# Patient Record
Sex: Female | Born: 1975 | Race: Black or African American | Hispanic: No | Smoking: Never smoker
Health system: Southern US, Community
[De-identification: ages and names within clinical notes are randomized; demographics above are authoritative.]

## PROBLEM LIST (undated history)

## (undated) DIAGNOSIS — D649 Anemia, unspecified: Secondary | ICD-10-CM

---
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
## 1999-02-27 ENCOUNTER — Emergency Department (HOSPITAL_COMMUNITY): Admission: EM | Admit: 1999-02-27 | Discharge: 1999-02-27 | Payer: Self-pay | Admitting: Emergency Medicine

## 1999-02-27 ENCOUNTER — Encounter: Payer: Self-pay | Admitting: Emergency Medicine

## 1999-12-25 ENCOUNTER — Emergency Department (HOSPITAL_COMMUNITY): Admission: EM | Admit: 1999-12-25 | Discharge: 1999-12-25 | Payer: Self-pay | Admitting: Emergency Medicine

## 2000-11-04 ENCOUNTER — Inpatient Hospital Stay (HOSPITAL_COMMUNITY): Admission: AD | Admit: 2000-11-04 | Discharge: 2000-11-04 | Payer: Self-pay | Admitting: *Deleted

## 2001-02-21 ENCOUNTER — Inpatient Hospital Stay (HOSPITAL_COMMUNITY): Admission: AD | Admit: 2001-02-21 | Discharge: 2001-02-21 | Payer: Self-pay | Admitting: Obstetrics

## 2001-02-24 ENCOUNTER — Inpatient Hospital Stay (HOSPITAL_COMMUNITY): Admission: AD | Admit: 2001-02-24 | Discharge: 2001-02-24 | Payer: Self-pay | Admitting: Obstetrics & Gynecology

## 2001-08-11 ENCOUNTER — Inpatient Hospital Stay (HOSPITAL_COMMUNITY): Admission: AD | Admit: 2001-08-11 | Discharge: 2001-08-11 | Payer: Self-pay | Admitting: Obstetrics

## 2001-08-13 ENCOUNTER — Inpatient Hospital Stay (HOSPITAL_COMMUNITY): Admission: AD | Admit: 2001-08-13 | Discharge: 2001-08-13 | Payer: Self-pay | Admitting: Obstetrics

## 2002-06-15 ENCOUNTER — Emergency Department (HOSPITAL_COMMUNITY): Admission: EM | Admit: 2002-06-15 | Discharge: 2002-06-15 | Payer: Self-pay | Admitting: Emergency Medicine

## 2002-06-18 ENCOUNTER — Emergency Department (HOSPITAL_COMMUNITY): Admission: EM | Admit: 2002-06-18 | Discharge: 2002-06-18 | Payer: Self-pay | Admitting: *Deleted

## 2002-11-02 ENCOUNTER — Inpatient Hospital Stay (HOSPITAL_COMMUNITY): Admission: AD | Admit: 2002-11-02 | Discharge: 2002-11-02 | Payer: Self-pay | Admitting: Obstetrics and Gynecology

## 2002-11-07 ENCOUNTER — Inpatient Hospital Stay (HOSPITAL_COMMUNITY): Admission: AD | Admit: 2002-11-07 | Discharge: 2002-11-07 | Payer: Self-pay | Admitting: *Deleted

## 2002-11-16 ENCOUNTER — Encounter: Payer: Self-pay | Admitting: Obstetrics and Gynecology

## 2002-11-16 ENCOUNTER — Inpatient Hospital Stay (HOSPITAL_COMMUNITY): Admission: AD | Admit: 2002-11-16 | Discharge: 2002-11-16 | Payer: Self-pay | Admitting: Obstetrics and Gynecology

## 2002-11-28 ENCOUNTER — Inpatient Hospital Stay (HOSPITAL_COMMUNITY): Admission: AD | Admit: 2002-11-28 | Discharge: 2002-11-28 | Payer: Self-pay | Admitting: *Deleted

## 2002-12-01 ENCOUNTER — Inpatient Hospital Stay (HOSPITAL_COMMUNITY): Admission: AD | Admit: 2002-12-01 | Discharge: 2002-12-01 | Payer: Self-pay | Admitting: *Deleted

## 2003-01-22 ENCOUNTER — Encounter: Payer: Self-pay | Admitting: Emergency Medicine

## 2003-01-22 ENCOUNTER — Emergency Department (HOSPITAL_COMMUNITY): Admission: EM | Admit: 2003-01-22 | Discharge: 2003-01-22 | Payer: Self-pay | Admitting: Emergency Medicine

## 2004-08-07 ENCOUNTER — Ambulatory Visit: Payer: Self-pay | Admitting: Internal Medicine

## 2005-04-22 ENCOUNTER — Inpatient Hospital Stay (HOSPITAL_COMMUNITY): Admission: AD | Admit: 2005-04-22 | Discharge: 2005-04-22 | Payer: Self-pay | Admitting: Obstetrics & Gynecology

## 2005-05-19 ENCOUNTER — Emergency Department (HOSPITAL_COMMUNITY): Admission: EM | Admit: 2005-05-19 | Discharge: 2005-05-19 | Payer: Self-pay | Admitting: Emergency Medicine

## 2005-05-21 ENCOUNTER — Inpatient Hospital Stay (HOSPITAL_COMMUNITY): Admission: EM | Admit: 2005-05-21 | Discharge: 2005-05-25 | Payer: Self-pay | Admitting: Emergency Medicine

## 2005-05-21 ENCOUNTER — Ambulatory Visit: Payer: Self-pay | Admitting: Infectious Diseases

## 2005-07-12 ENCOUNTER — Ambulatory Visit (HOSPITAL_COMMUNITY): Admission: RE | Admit: 2005-07-12 | Discharge: 2005-07-12 | Payer: Self-pay | Admitting: Obstetrics & Gynecology

## 2005-07-18 ENCOUNTER — Inpatient Hospital Stay (HOSPITAL_COMMUNITY): Admission: AD | Admit: 2005-07-18 | Discharge: 2005-07-19 | Payer: Self-pay | Admitting: Obstetrics

## 2005-08-28 ENCOUNTER — Ambulatory Visit (HOSPITAL_COMMUNITY): Admission: RE | Admit: 2005-08-28 | Discharge: 2005-08-28 | Payer: Self-pay | Admitting: Obstetrics and Gynecology

## 2005-09-17 ENCOUNTER — Inpatient Hospital Stay (HOSPITAL_COMMUNITY): Admission: AD | Admit: 2005-09-17 | Discharge: 2005-09-17 | Payer: Self-pay | Admitting: Obstetrics & Gynecology

## 2005-09-24 ENCOUNTER — Inpatient Hospital Stay (HOSPITAL_COMMUNITY): Admission: AD | Admit: 2005-09-24 | Discharge: 2005-09-24 | Payer: Self-pay | Admitting: Obstetrics

## 2005-09-28 ENCOUNTER — Ambulatory Visit (HOSPITAL_COMMUNITY): Admission: RE | Admit: 2005-09-28 | Discharge: 2005-09-28 | Payer: Self-pay | Admitting: Obstetrics & Gynecology

## 2005-10-11 ENCOUNTER — Ambulatory Visit (HOSPITAL_COMMUNITY): Admission: RE | Admit: 2005-10-11 | Discharge: 2005-10-11 | Payer: Self-pay | Admitting: Obstetrics & Gynecology

## 2005-11-20 ENCOUNTER — Inpatient Hospital Stay (HOSPITAL_COMMUNITY): Admission: AD | Admit: 2005-11-20 | Discharge: 2005-11-22 | Payer: Self-pay | Admitting: Obstetrics & Gynecology

## 2006-02-04 IMAGING — US US OB LIMITED
1 series · 14 of 28 positions shown · non-contrast
Comparison: none

CLINICAL DATA: Assess amniotic fluid volume.

[Series 1: us ob limited · 0.33mm/px · 14 of 28 slices shown]
[im 2/28]
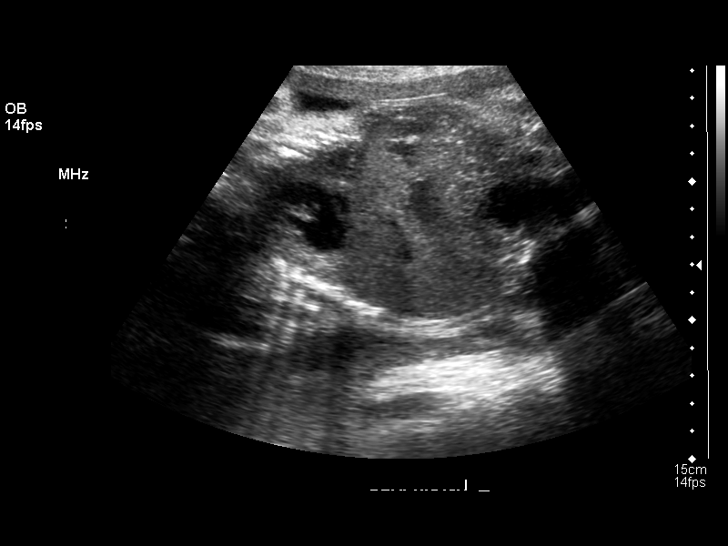
[im 4/28]
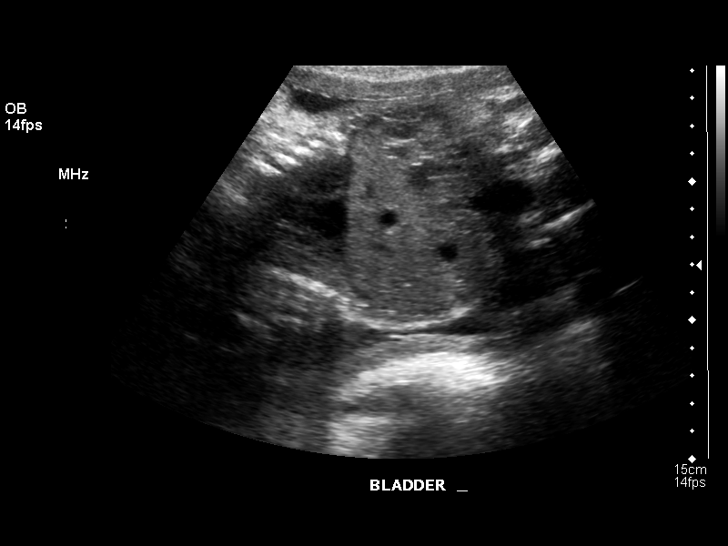
[im 6/28]
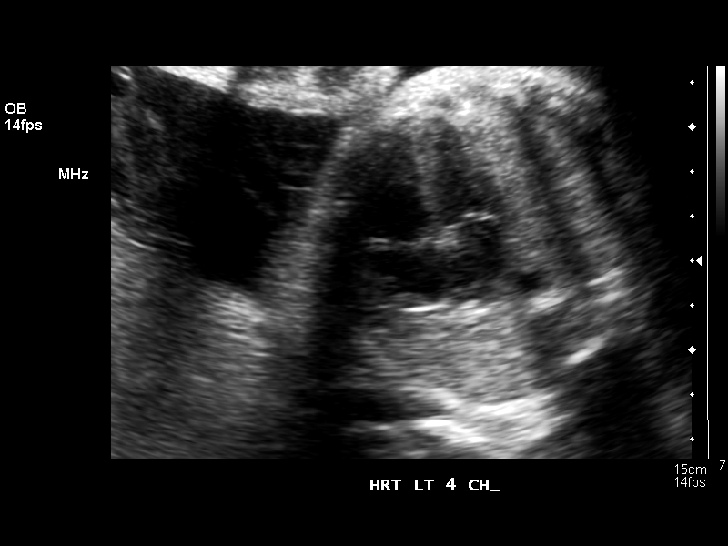
[im 8/28]
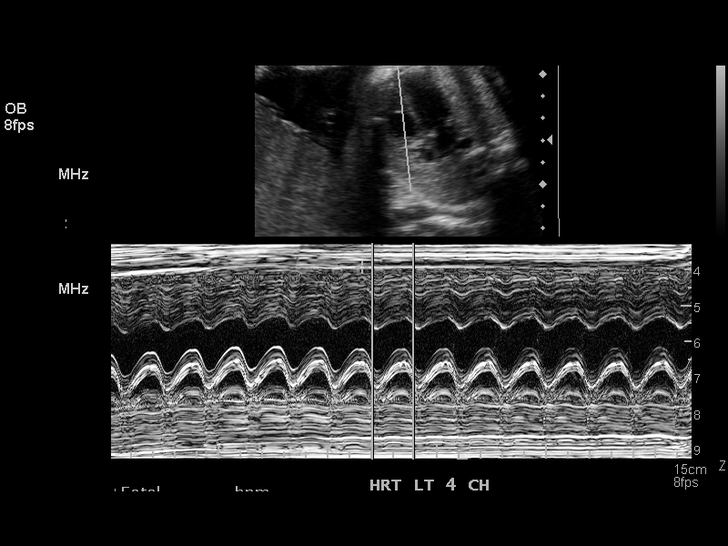
[im 10/28]
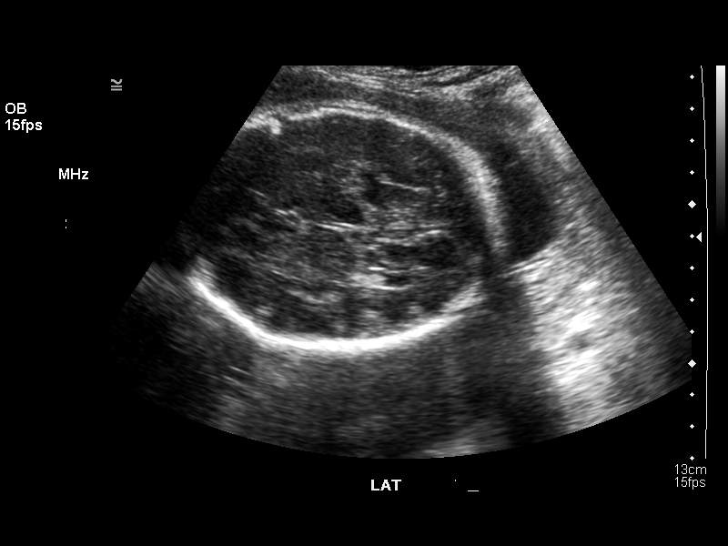
[im 12/28]
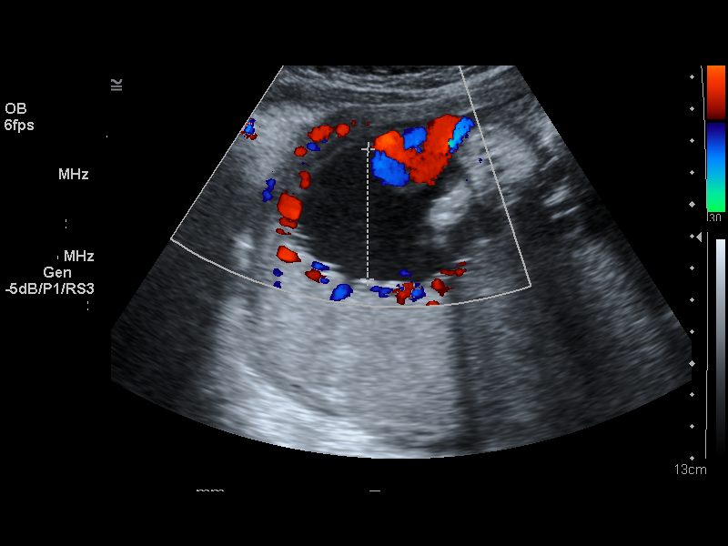
[im 14/28]
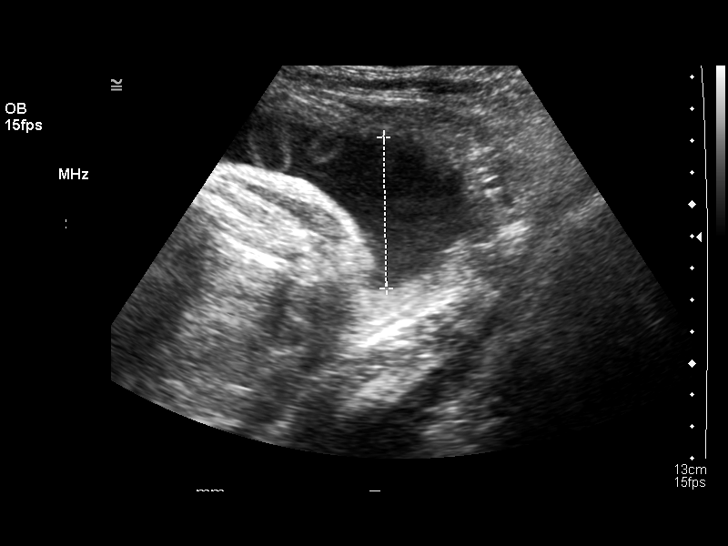
[im 16/28]
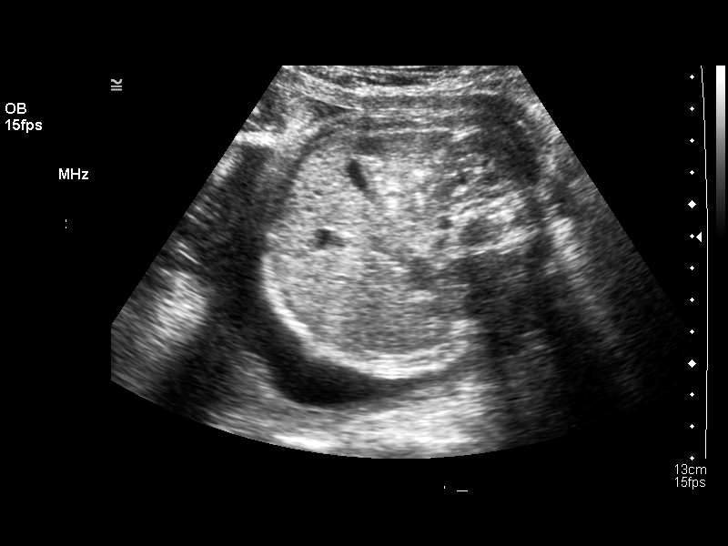
[im 18/28]
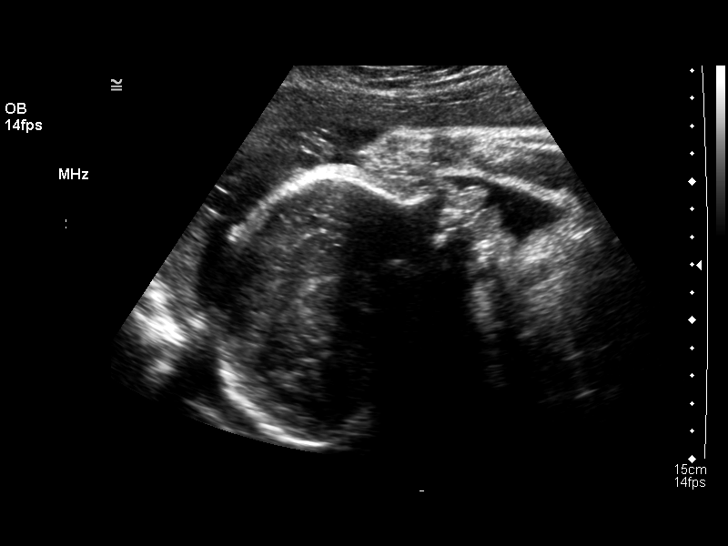
[im 20/28]
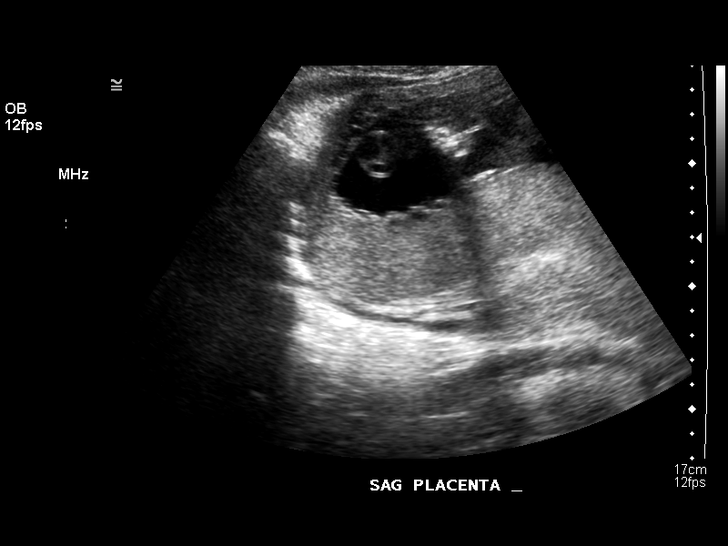
[im 22/28]
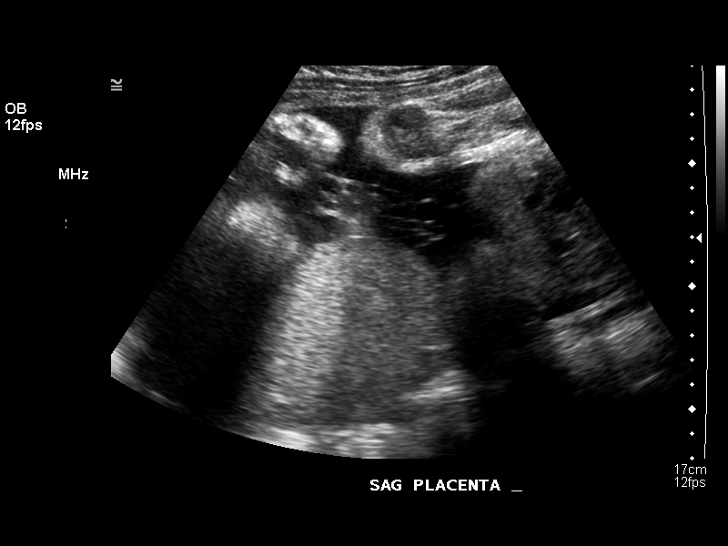
[im 24/28]
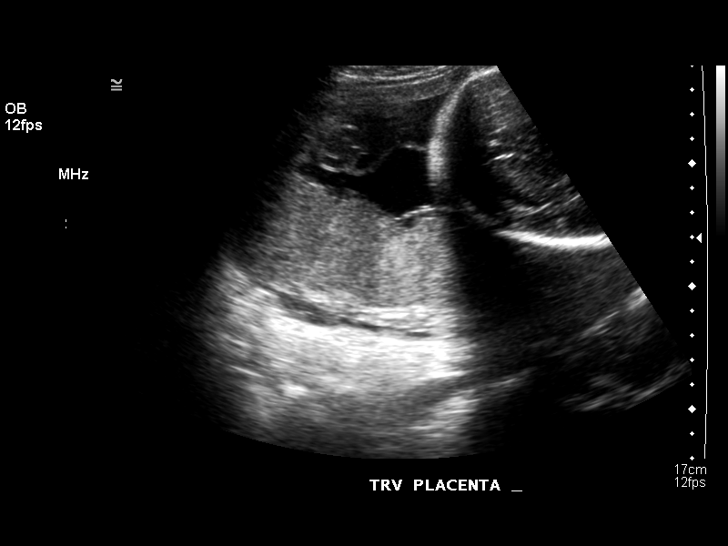
[im 26/28]
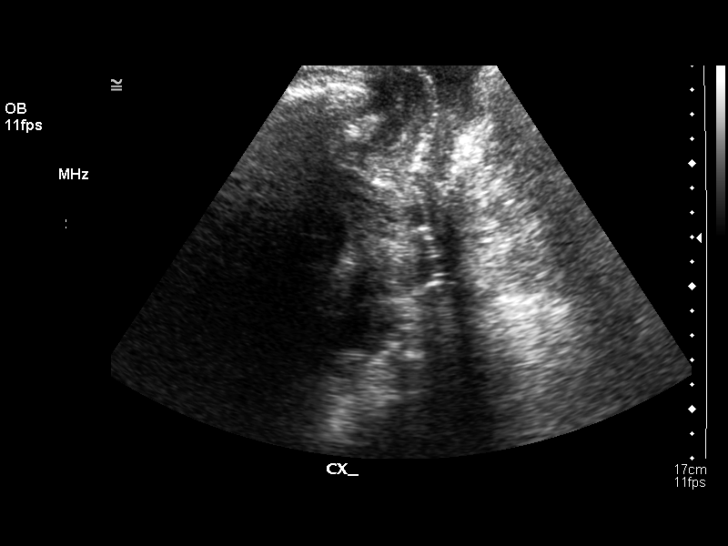
[im 28/28]
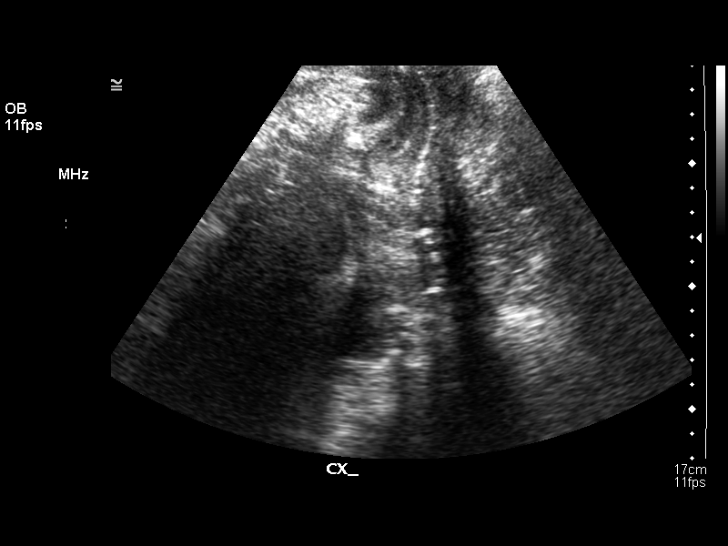

[14 of 28 positions shown; findings below may reference images not displayed]

LIMITED OBSTETRICAL ULTRASOUND:
Number of Fetuses:  1
Heart Rate:  160
Movement:  Yes
Breathing:  Yes
Presentation:  Breech
Placental Location:  Posterior
Grade:  I
Previa:  No
Amniotic Fluid (Subjective):  Normal
Amniotic Fluid (Objective):  11.9 cm AFI (5th -95th%ile = 8.8 ? 23.8 cm for 31 wks)

Fetal measurements and complete anatomic evaluation were not requested.  The following fetal anatomy was visualized during this exam:  Lateral ventricles, four chamber heart, stomach, kidneys, bladder, diaphragm, and male genitalia.  

MATERNAL UTERINE AND ADNEXAL FINDINGS
Cervix:  3.2 cm Translabially
IMPRESSION: 1.  Subjectively and quantitatively normal amniotic fluid volume.
2.  Normal cervical length.
3.  No late developing fetal anatomic abnormalities are identified associated with the lateral ventricles, four chamber heart, stomach, kidneys, or bladder.  
4.  Current breech positioning.

## 2007-05-27 ENCOUNTER — Emergency Department (HOSPITAL_COMMUNITY): Admission: EM | Admit: 2007-05-27 | Discharge: 2007-05-27 | Payer: Self-pay | Admitting: Emergency Medicine

## 2010-01-23 ENCOUNTER — Ambulatory Visit: Payer: Self-pay | Admitting: Obstetrics & Gynecology

## 2010-01-23 ENCOUNTER — Observation Stay (HOSPITAL_COMMUNITY): Admission: AD | Admit: 2010-01-23 | Discharge: 2010-01-24 | Payer: Self-pay | Admitting: Obstetrics & Gynecology

## 2010-05-07 ENCOUNTER — Ambulatory Visit: Payer: Self-pay | Admitting: Nurse Practitioner

## 2010-05-07 ENCOUNTER — Inpatient Hospital Stay (HOSPITAL_COMMUNITY): Admission: AD | Admit: 2010-05-07 | Discharge: 2010-05-07 | Payer: Self-pay | Admitting: Obstetrics and Gynecology

## 2010-06-22 ENCOUNTER — Ambulatory Visit: Payer: Self-pay | Admitting: Gynecology

## 2010-06-22 ENCOUNTER — Inpatient Hospital Stay (HOSPITAL_COMMUNITY): Admission: AD | Admit: 2010-06-22 | Discharge: 2010-06-22 | Payer: Self-pay | Admitting: Obstetrics & Gynecology

## 2010-09-30 ENCOUNTER — Encounter: Payer: Self-pay | Admitting: Obstetrics & Gynecology

## 2010-11-22 LAB — WET PREP, GENITAL: Trich, Wet Prep: NONE SEEN

## 2010-11-22 LAB — URINALYSIS, ROUTINE W REFLEX MICROSCOPIC
Bilirubin Urine: NEGATIVE
Ketones, ur: NEGATIVE mg/dL
Nitrite: NEGATIVE
Protein, ur: NEGATIVE mg/dL
Specific Gravity, Urine: 1.025 (ref 1.005–1.030)
Urobilinogen, UA: 1 mg/dL (ref 0.0–1.0)

## 2010-11-22 LAB — DIFFERENTIAL
Basophils Relative: 0 % (ref 0–1)
Eosinophils Relative: 1 % (ref 0–5)
Lymphs Abs: 2.1 10*3/uL (ref 0.7–4.0)
Monocytes Absolute: 0.3 10*3/uL (ref 0.1–1.0)
Monocytes Relative: 5 % (ref 3–12)

## 2010-11-22 LAB — CBC
HCT: 30.7 % — ABNORMAL LOW (ref 36.0–46.0)
Hemoglobin: 9.8 g/dL — ABNORMAL LOW (ref 12.0–15.0)
MCH: 24.3 pg — ABNORMAL LOW (ref 26.0–34.0)
MCV: 75.8 fL — ABNORMAL LOW (ref 78.0–100.0)
RBC: 4.05 MIL/uL (ref 3.87–5.11)

## 2010-11-22 LAB — GC/CHLAMYDIA PROBE AMP, GENITAL: Chlamydia, DNA Probe: NEGATIVE

## 2010-11-23 LAB — GC/CHLAMYDIA PROBE AMP, GENITAL: GC Probe Amp, Genital: NEGATIVE

## 2010-11-23 LAB — URINALYSIS, ROUTINE W REFLEX MICROSCOPIC
Bilirubin Urine: NEGATIVE
Glucose, UA: NEGATIVE mg/dL
Ketones, ur: NEGATIVE mg/dL
Nitrite: NEGATIVE
Protein, ur: NEGATIVE mg/dL

## 2010-11-23 LAB — URINE MICROSCOPIC-ADD ON

## 2010-11-23 LAB — WET PREP, GENITAL
Trich, Wet Prep: NONE SEEN
Yeast Wet Prep HPF POC: NONE SEEN

## 2010-11-23 LAB — POCT PREGNANCY, URINE: Preg Test, Ur: NEGATIVE

## 2010-11-27 LAB — URINE MICROSCOPIC-ADD ON

## 2010-11-27 LAB — CROSSMATCH

## 2010-11-27 LAB — URINALYSIS, ROUTINE W REFLEX MICROSCOPIC
Bilirubin Urine: NEGATIVE
Glucose, UA: NEGATIVE mg/dL
Ketones, ur: NEGATIVE mg/dL
Protein, ur: 30 mg/dL — AB
Urobilinogen, UA: 2 mg/dL — ABNORMAL HIGH (ref 0.0–1.0)

## 2010-11-27 LAB — CBC
HCT: 24.8 % — ABNORMAL LOW (ref 36.0–46.0)
Hemoglobin: 6.2 g/dL — CL (ref 12.0–15.0)
Hemoglobin: 7.9 g/dL — ABNORMAL LOW (ref 12.0–15.0)
MCHC: 30.8 g/dL (ref 30.0–36.0)
MCHC: 31.8 g/dL (ref 30.0–36.0)
MCV: 62.4 fL — ABNORMAL LOW (ref 78.0–100.0)
RBC: 3.23 MIL/uL — ABNORMAL LOW (ref 3.87–5.11)
RDW: 22.3 % — ABNORMAL HIGH (ref 11.5–15.5)
RDW: 27.6 % — ABNORMAL HIGH (ref 11.5–15.5)

## 2010-11-27 LAB — GC/CHLAMYDIA PROBE AMP, GENITAL
Chlamydia, DNA Probe: POSITIVE — AB
GC Probe Amp, Genital: NEGATIVE

## 2010-11-27 LAB — WET PREP, GENITAL
Trich, Wet Prep: NONE SEEN
Yeast Wet Prep HPF POC: NONE SEEN

## 2011-01-26 NOTE — Consult Note (Signed)
Kiara [name], Kiara [name]             ACCOUNT NO.:  [id number]   MEDICAL RECORD NO.:  [id number]          PATIENT TYPE:  INP   LOCATION:  0102                         FACILITY:  [facility]   PHYSICIAN:  Artist Pais. Mina Marble, M.D.DATE OF BIRTH:  [birth year]   DATE OF CONSULTATION:  05/21/2005  DATE OF DISCHARGE:                                   CONSULTATION   REQUESTING PHYSICIAN:  Paula Libra, M.D.   REASON FOR CONSULTATION:  Kiara [name] is a 35 year old right hand dominant  who presents today with a 48-72 hour history of pain, swelling and probable  MRSA infection on the dorsal aspect of her right hand and fifth digit. She  was seen in the ER, placed on Keflex 48 hours earlier, she has gotten  significantly worse since that point in time and presents today with  worsening redness, pain and probable deep abscess of the extensor sheath  right hand fifth digit. She is an otherwise healthy 35 year old, she is [redacted]  weeks pregnant, she has no known drug allergies. No current medications. No  recent hospitalizations or surgeries.   FAMILY HISTORY:  Noncontributory.   SOCIAL HISTORY:  Noncontributory.   PHYSICAL EXAMINATION:  GENERAL:  Well-developed, well-nourished female,  pleasant, looked fatigued.  EXTREMITIES:  Examination of right hand shows pain and swelling of the  proximal phalanx, metacarpal phalangeal joint and 2 cm proximal to this  along the dorsum of the hand along the fifth metacarpal and proximal  phalangeal area. She can fully flex and extend, she does have pain with  terminal flexion, no pain over the flexor sheath but significant pain and  swelling over the extensor sheath in a small raised area consistent with  probable abscess of the extensor mechanism and probable extensor sheath  infection. She has no __________ abdominal adenopathy. X-rays are deferred  secondary to her pregnant state.   IMPRESSION:  A 35 year old female with probable worsening extensor sheath  infection  secondary to probable MRSA.   RECOMMENDATIONS:  To take her to the operating room for an I&D. She is to be  seen by infectious disease for a consult regarding IV antibiotics due to her  state of pregnancy and we will get her admitted and taken to the operating  room as soon as possible.      [name], M.D.  Electronically Signed     MAW/MEDQ  D:  05/21/2005  T:  05/22/2005  Job:  [id number]

## 2011-01-26 NOTE — Discharge Summary (Signed)
Kiara [name], Kiara [name]             ACCOUNT NO.:  [id number]   MEDICAL RECORD NO.:  [id number]          PATIENT TYPE:  INP   LOCATION:  1411                         FACILITY:  [facility]   PHYSICIAN:  Kiara [name], M.D.DATE OF BIRTH:  1975/11/23   DATE OF ADMISSION:  05/21/2005  DATE OF DISCHARGE:  05/25/2005                                 DISCHARGE SUMMARY   ADMISSION DIAGNOSIS:  Cellulitis and extensor sheath infection to the right  fifth finger.   SECONDARY DIAGNOSES:  Ten weeks pregnant.   DISCHARGE DIAGNOSES:  1.  Cellulitis and extensor sheath infection to the right fifth finger.  2.  Ten weeks pregnant.   OPERATIVE PROCEDURE:  Irrigation and debridement x2 with secondary wound  closure on the second surgery.   CONSULTATIONS:  Infectious disease for assistance with antibiotic coverage.   BRIEF HISTORY:  Patient is a 35 year old black female who presents with 48-  72 hours of pain, swelling, increasing redness, and discomfort to the right  hand, fifth digit dorsally with a probable methicillin-resistant  staphylococcus aureus infection.  She was seen in the emergency room.  Placed on Keflex.  It got significantly worse.  We were consulted for  further evaluation and treatment.   The patient is [redacted] weeks pregnant.  She has no known drug allergies.  No  current medications.  No recent hospitalization or surgery.   HOSPITAL COURSE:  Patient was admitted on May 21, 2005 for right hand  abscess and infection.  Underwent irrigation and debridement with packing of  the wound.  Cultures were obtained.  Infectious disease assisted in  antibiotic coverage.  Recommended vancomycin secondary to pregnancy.  She  did not have a good oral option secondary to drug side effects with her  pregnancy.   The first day postoperatively, the patient was doing well.  Dressing was  dry.  Neurovascularly intact.  Arrangements were made for repeat I&D and  secondary wound closure on  May 23, 2005.  Continued on IV vancomycin.  She tolerated this well and general anesthesia.  She was ready for discharge  home on May 25, 2005.  It was necessary for her to be on IV vancomycin  for five days postoperatively.  Home outpatient IV antibiotics were arranged  through [facility].  She did not meet criteria for inpatient IV  antibiotics.  She is ready for discharge on home May 25, 2005.   PERTINENT LABORATORY FINDINGS:  WBC 9.4.  Sodium 134, potassium 3.4.  Cultures were positive for methicillin-resistant staphylococcus aureus.  Blood cultures were sterile and negative.  Methicillin-resistant  staphylococcus aureus was sensitive to vancomycin.  She continued on this  for treatment.   DISCHARGE CONDITION:  Stable and improved.   DISCHARGE DIAGNOSIS:  Right hand and fifth digit extensor sheath infection  and abscess.   DISCHARGE CONDITION:  Stable and improved.   DISCHARGE INSTRUCTIONS:  Discharged home.  Home IV vancomycin x5 days, 1 gm  q.12h. with home health teaching and administration instructions.  She was  also placed on Lotrimin for yeast infection, dose per pharmacy.  She is to  follow  up in our office on  Tuesday.  She is to keep the dressing clean and  dry.  She is to contact our office and follow up if she has any increasing  redness, pain, swelling, fevers, feelings of malaise, or any concerns  whatsoever.  She is not a candidate for p.o. medications because the  medications to cover methicillin-resistant staphylococcus aureus are not  compatible with pregnancy secondary to side effects; therefore, it was  necessary for her to stay on IV vancomycin 1 gm q.12h. x5 days.      Kiara [name].      Kiara [name], M.D.  Electronically Signed    SCI/MEDQ  D:  05/31/2005  T:  06/01/2005  Job:  [id number]

## 2011-01-26 NOTE — Op Note (Signed)
[name], [name]             ACCOUNT NO.:  [id number]   MEDICAL RECORD NO.:  [id number]          PATIENT TYPE:  INP   LOCATION:  1411                         FACILITY:  [facility]   PHYSICIAN:  Artist Pais. Weingold, M.D.DATE OF BIRTH:  [birth year]   DATE OF PROCEDURE:  05/23/2005  DATE OF DISCHARGE:                                 OPERATIVE REPORT   PREOPERATIVE DIAGNOSIS:  Methicillin-resistant staphylococcus aureus  infection, right hand.   POSTOPERATIVE DIAGNOSIS:  Methicillin-resistant staphylococcus aureus  infection, right hand.   PROCEDURE:  Incision and drainage and secondary wound closure, right hand.   SURGEON:  Artist Pais. Mina Marble, M.D.   ASSISTANT:  __________.   ANESTHESIA:  General.   TOURNIQUET TIME:  None.   COMPLICATIONS:  None.   DRAINS:  None.   OPERATIVE REPORT:  Patient was taken to the operating room.  After the  induction of adequate general anesthesia, the right upper extremity was  prepped and draped in the usual sterile fashion.  Once this was done, the  wound that was previously packed open with iodoform gauze over the  metacarpophalangeal and proximal interphalangeal joints of the right hand  was opened.  The packing was removed.  The wound was then thoroughly  irrigated and debrided using 3 liters of normal saline under pulse lavage.  After this was completed, the wound was secondarily debrided and then closed  with 4-0 nylon.  A sterile dressing of Xeroform, 4x4's, compressive wraps  were applied.  The patient tolerated the procedure well.  She was taken to  the recovery room in a stable fashion.      [name], M.D.  Electronically Signed     MAW/MEDQ  D:  05/23/2005  T:  05/23/2005  Job:  [id number]

## 2011-01-26 NOTE — Op Note (Signed)
Kiara [name], Kiara [name]             ACCOUNT NO.:  [id number]   MEDICAL RECORD NO.:  [id number]          PATIENT TYPE:  INP   LOCATION:  0102                         FACILITY:  [facility]   PHYSICIAN:  Artist Pais. Weingold, M.D.DATE OF BIRTH:  [birth year]   DATE OF PROCEDURE:  05/21/2005  DATE OF DISCHARGE:                                 OPERATIVE REPORT   PREOPERATIVE DIAGNOSIS:  Infected right hand and fifth digit extensor  sheath.   POSTOPERATIVE DIAGNOSIS:  Infected right hand and fifth digit extensor  sheath.   PROCEDURE:  Incision and drainage extensor sheath, right hand, fifth digit.   SURGEON:  Artist Pais. Mina Marble, M.D.   ASSISTANT:  None.   ANESTHESIA:  General.   TOURNIQUET TIME:  Twelve minutes.   COMPLICATIONS:  None.   DRAINS:  None.   CULTURES SENT:  Two.   The wound was packed open with 1/4 inch iodoform packing.   OPERATION REPORT:  Patient was taken to the operating room.  After the  induction of general anesthesia, the right upper extremity was prepped in  the usual sterile fashion.  Esmarch was used to exsanguinated the limb.  Tourniquet was inflated 250 mmHg.  At this point in time, a longitudinal  incision was made, sent up to the metacarpophalangeal joint and the proximal  middle phalangeal joint in the midline over the fifth digit.  Once the skin  was incised, large amounts of purulent material were encountered throughout  the entire extensor sheath.  This was carefully cultured for aerobic,  anaerobic, fungal, AFB, etc.  The wound was then thoroughly irrigated with 3  liters of normal saline using post lavage and packed open with 0.25%  iodoform packing material, then dressed with Xeroform, 4x4s, and a  compressive wrap.  The patient tolerated the procedure well.  Was taken to  the recovery room in a stable fashion.      [name], M.D.  Electronically Signed    MAW/MEDQ  D:  05/21/2005  T:  05/22/2005  Job:  [id number]

## 2011-06-21 LAB — URINALYSIS, ROUTINE W REFLEX MICROSCOPIC
Bilirubin Urine: NEGATIVE
Nitrite: NEGATIVE
Specific Gravity, Urine: 1.022
pH: 6

## 2011-06-21 LAB — DIFFERENTIAL
Eosinophils Absolute: 0.1
Eosinophils Relative: 2
Lymphs Abs: 2.1
Monocytes Relative: 6
Neutrophils Relative %: 60

## 2011-06-21 LAB — CBC
MCHC: 33.7
RDW: 17.8 — ABNORMAL HIGH

## 2011-06-21 LAB — COMPREHENSIVE METABOLIC PANEL
ALT: 12
AST: 15
Calcium: 9.1
GFR calc Af Amer: >60
Sodium: 135
Total Protein: 7.2

## 2011-06-21 LAB — POCT PREGNANCY, URINE: Operator id: 173591

## 2018-06-15 ENCOUNTER — Emergency Department (HOSPITAL_COMMUNITY)
Admission: EM | Admit: 2018-06-15 | Discharge: 2018-06-16 | Disposition: A | Discharge status: Home / Self Care | Payer: Self-pay | Attending: Emergency Medicine | Admitting: Emergency Medicine

## 2018-06-15 ENCOUNTER — Emergency Department (HOSPITAL_COMMUNITY): Payer: Self-pay

## 2018-06-15 ENCOUNTER — Encounter (HOSPITAL_COMMUNITY): Payer: Self-pay

## 2018-06-15 DIAGNOSIS — N938 Other specified abnormal uterine and vaginal bleeding: Secondary | ICD-10-CM

## 2018-06-15 DIAGNOSIS — N939 Abnormal uterine and vaginal bleeding, unspecified: Secondary | ICD-10-CM | POA: Insufficient documentation

## 2018-06-15 DIAGNOSIS — D649 Anemia, unspecified: Secondary | ICD-10-CM | POA: Insufficient documentation

## 2018-06-15 LAB — BASIC METABOLIC PANEL
ANION GAP: 8 (ref 5–15)
BUN: 14 mg/dL (ref 6–20)
CALCIUM: 8.3 mg/dL — AB (ref 8.9–10.3)
CO2: 17 mmol/L — ABNORMAL LOW (ref 22–32)
Chloride: 106 mmol/L (ref 98–111)
Creatinine, Ser: 0.69 mg/dL (ref 0.44–1.00)
GFR calc Af Amer: >60 mL/min (ref >60)
Glucose, Bld: 103 mg/dL — ABNORMAL HIGH (ref 70–99)
POTASSIUM: 2.9 mmol/L — AB (ref 3.5–5.1)
SODIUM: 131 mmol/L — AB (ref 135–145)

## 2018-06-15 LAB — CBC
HEMATOCRIT: 21 % — AB (ref 36.0–46.0)
HEMOGLOBIN: 5.2 g/dL — AB (ref 12.0–15.0)
MCH: 16.8 pg — ABNORMAL LOW (ref 26.0–34.0)
MCHC: 24.8 g/dL — ABNORMAL LOW (ref 30.0–36.0)
MCV: 68 fL — ABNORMAL LOW (ref 78.0–100.0)
Platelets: 569 10*3/uL — ABNORMAL HIGH (ref 150–400)
RBC: 3.09 MIL/uL — ABNORMAL LOW (ref 3.87–5.11)
RDW: 30.5 % — AB (ref 11.5–15.5)
WBC: 8.3 10*3/uL (ref 4.0–10.5)

## 2018-06-15 LAB — I-STAT TROPONIN, ED: Troponin i, poc: 0 ng/mL (ref 0.00–0.08)

## 2018-06-15 LAB — I-STAT BETA HCG BLOOD, ED (MC, WL, AP ONLY)

## 2018-06-15 NOTE — ED Triage Notes (Signed)
To triage via EMS. Pt had been cleaning today with cleaning products and started having intermittant palpitations and diaphoresis.  No chest pain.  EMS BP 138/70 HR 120's SpO2 100 % CBG 100

## 2018-06-15 NOTE — ED Notes (Signed)
Dr. Delo at bedside. 

## 2018-06-15 NOTE — ED Notes (Addendum)
Dr. Jodi Mourning aware Hgb 5.2 *

## 2018-06-16 ENCOUNTER — Emergency Department (HOSPITAL_COMMUNITY): Payer: Self-pay

## 2018-06-16 LAB — PREPARE RBC (CROSSMATCH)

## 2018-06-16 LAB — ABO/RH: ABO/RH(D): A POS

## 2018-06-16 MED ORDER — FERROUS SULFATE 325 (65 FE) MG PO TABS: 325.0000 mg | ORAL_TABLET | Freq: Two times a day (BID) | ORAL | 1 refills | Status: DC

## 2018-06-16 MED ORDER — SODIUM CHLORIDE 0.9 % IV SOLN
10.0000 mL/h | Freq: Once | INTRAVENOUS | Status: AC
  Administered 2018-06-16: 10 mL/h via INTRAVENOUS

## 2018-06-16 NOTE — ED Notes (Signed)
Patient given discharge instructions and verbalized understanding.  Patient stable to discharge at this time.  Patient is alert and oriented to baseline.  No distressed noted at this time.  All belongings taken with the patient at discharge.   

## 2018-06-16 NOTE — Discharge Instructions (Signed)
Iron as prescribed.  Follow-up with GYN.  The contact information for the women's outpatient clinic has been provided in this discharge summary for you to call and make these arrangements.

## 2018-06-16 NOTE — ED Notes (Signed)
Pt ambulatory to BR

## 2018-06-16 NOTE — ED Notes (Signed)
First unit of blood complete. Pt to u/s via stretcher. Family at bedside

## 2018-06-16 NOTE — ED Provider Notes (Addendum)
[facility] EMERGENCY DEPARTMENT Provider Note   CSN: [id number] Arrival date & time: 06/15/18  2209     History   Chief Complaint Chief Complaint  Patient presents with  . Palpitations    HPI Kiara [name] is a 42 y.o. female.  Patient is a 42 year old female with past medical history of dysfunctional uterine bleeding and anemia.  She presents today for evaluation of palpitations.  She states that she was performing cleaning at the house today when she developed shortness of breath and palpitations.  She was brought here by EMS for evaluation.  She denies any chest pain.  She does report the bleeding with her periods, however is not currently bleeding.  She has been transfused in the past for similar issues.  She tells me she is supposed to be taking iron, however has not been on this for quite some time.  The history is provided by the patient.  Palpitations   This is a new problem. The current episode started 1 to 2 hours ago. The problem occurs constantly. The problem has not changed since onset.The problem is associated with an unknown factor. Associated symptoms include diaphoresis and shortness of breath. She has tried nothing for the symptoms.    History reviewed. No pertinent past medical history.  There are no active problems to display for this patient.   History reviewed. No pertinent surgical history.   OB History   None      Home Medications    Prior to Admission medications   Not on File    Family History History reviewed. No pertinent family history.  Social History Social History   Tobacco Use  . Smoking status: Never Smoker  . Smokeless tobacco: Never Used  Substance Use Topics  . Alcohol use: Never    Frequency: Never  . Drug use: Never     Allergies   Patient has no known allergies.   Review of Systems Review of Systems  Constitutional: Positive for diaphoresis.  Respiratory: Positive for shortness of  breath.   Cardiovascular: Positive for palpitations.  All other systems reviewed and are negative.    Physical Exam Updated Vital Signs BP 131/88   Pulse (!) 116   Temp 99.2 F (37.3 C) (Oral)   Resp (!) 22   LMP 06/08/2018   SpO2 100%   Physical Exam  Constitutional: She is oriented to person, place, and time. She appears well-developed and well-nourished. No distress.  HENT:  Head: Normocephalic and atraumatic.  Neck: Normal range of motion. Neck supple.  Cardiovascular: Regular rhythm. Exam reveals no gallop and no friction rub.  No murmur heard. Heart is tachycardic, but regular  Pulmonary/Chest: Effort normal and breath sounds normal. No respiratory distress. She has no wheezes.  Abdominal: Soft. Bowel sounds are normal. She exhibits no distension. There is no tenderness.  Musculoskeletal: Normal range of motion.  Neurological: She is alert and oriented to person, place, and time.  Skin: Skin is warm and dry. She is not diaphoretic.  Nursing note and vitals reviewed.    ED Treatments / Results  Labs (all labs ordered are listed, but only abnormal results are displayed) Labs Reviewed  BASIC METABOLIC PANEL - Abnormal; Notable for the following components:      Result Value   Sodium 131 (*)    Potassium 2.9 (*)    CO2 17 (*)    Glucose, Bld 103 (*)    Calcium 8.3 (*)    All other components  within normal limits  CBC - Abnormal; Notable for the following components:   RBC 3.09 (*)    Hemoglobin 5.2 (*)    HCT 21.0 (*)    MCV 68.0 (*)    MCH 16.8 (*)    MCHC 24.8 (*)    RDW 30.5 (*)    Platelets 569 (*)    All other components within normal limits  I-STAT TROPONIN, ED  I-STAT BETA HCG BLOOD, ED ([facility], [facility], [facility] ONLY)  PREPARE RBC (CROSSMATCH)    EKG None  Radiology Dg Chest 2 View  Result Date: 06/15/2018 CLINICAL DATA:  Palpitations. EXAM: CHEST - 2 VIEW COMPARISON:  None. FINDINGS: The cardiomediastinal contours are normal. The lungs are clear.  Pulmonary vasculature is normal. No consolidation, pleural effusion, or pneumothorax. No acute osseous abnormalities are seen. IMPRESSION: Negative radiographs of the chest. Electronically Signed   By: Narda Rutherford M.D.   On: 06/15/2018 22:32    Procedures Procedures (including critical care time)  Medications Ordered in ED Medications  0.9 %  sodium chloride infusion (has no administration in time range)     Initial Impression / Assessment and Plan / ED Course  I have reviewed the triage vital signs and the nursing notes.  Pertinent labs & imaging results that were available during my care of the patient were reviewed by me and considered in my medical decision making (see chart for details).  Patient presents here with weakness, lightheadedness, and a hemoglobin of 5.2.  She has a history of heavy periods and fibroids.  She was transfused 2 units of packed red cells.  Ultrasound shows fibroids and an abnormal endometrial lining.  Recommendations are for follow-up with GYN.  She will be started on iron replacement.  CRITICAL CARE Performed by: Geoffery Lyons Total critical care time: 45 minutes Critical care time was exclusive of separately billable procedures and treating other patients. Critical care was necessary to treat or prevent imminent or life-threatening deterioration. Critical care was time spent personally by me on the following activities: development of treatment plan with patient and/or surrogate as well as nursing, discussions with consultants, evaluation of patient's response to treatment, examination of patient, obtaining history from patient or surrogate, ordering and performing treatments and interventions, ordering and review of laboratory studies, ordering and review of radiographic studies, pulse oximetry and re-evaluation of patient's condition.   Final Clinical Impressions(s) / ED Diagnoses   Final diagnoses:  None    ED Discharge Orders    None         Geoffery Lyons, MD 06/16/18 1610    Geoffery Lyons, MD 06/29/18 0532

## 2018-06-16 NOTE — ED Provider Notes (Signed)
Blood pressure 106/77, pulse 78, temperature 98.9 F (37.2 C), temperature source Oral, resp. rate 14, last menstrual period 06/08/2018, SpO2 100 %.  Assuming care from Dr. Judd Lien.  In short, Kiara [name] is a 42 y.o. female with a chief complaint of Palpitations .  Refer to the original H&P for additional details.  The current [name] of care is to f/u patient after blood transfusion.   7:10 AM Patient completed her second PRBC transfusion. She is feeling better. Will f/u with Gyn and ASAP for repeat CBC and mgmt of anemia 2/2 vaginal bleeding. Patient is comfortable with the [name] at discharge.   Kiara Bene, MD    Kiara Plan, MD 06/16/18 [phone]

## 2018-06-17 LAB — TYPE AND SCREEN
ABO/RH(D): A POS
Antibody Screen: NEGATIVE
UNIT DIVISION: 0
UNIT DIVISION: 0

## 2018-06-17 LAB — BPAM RBC
BLOOD PRODUCT EXPIRATION DATE: 201910252359
BLOOD PRODUCT EXPIRATION DATE: 201910292359
ISSUE DATE / TIME: 201910070203
ISSUE DATE / TIME: 201910070427
UNIT TYPE AND RH: 6200
Unit Type and Rh: 6200

## 2018-10-09 IMAGING — DX DG CHEST 2V
2 series · 2 of 2 positions shown · non-contrast
Comparison: None.

CLINICAL DATA: Palpitations.

EXAM:
CHEST - 2 VIEW

[chest pa]
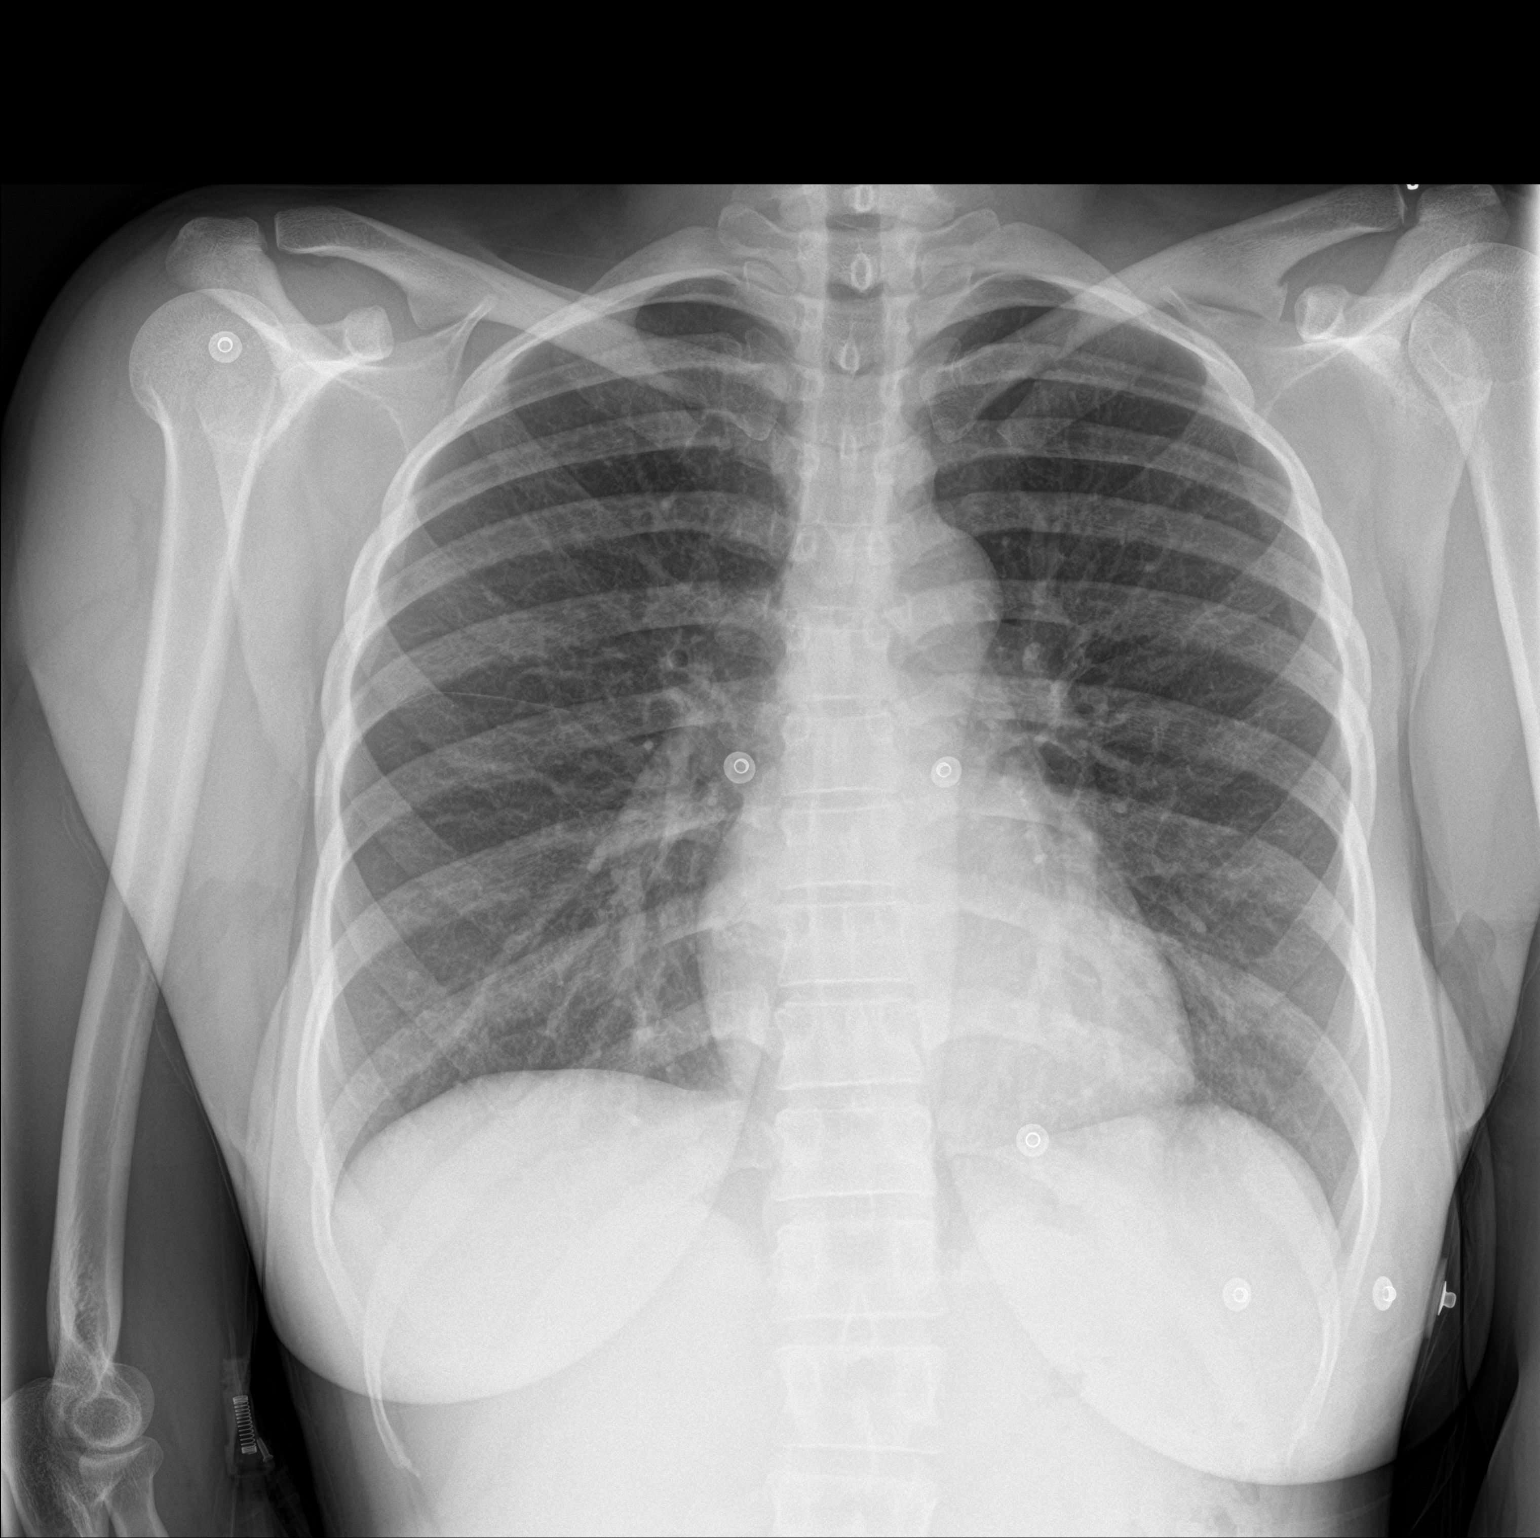

[chest lat]
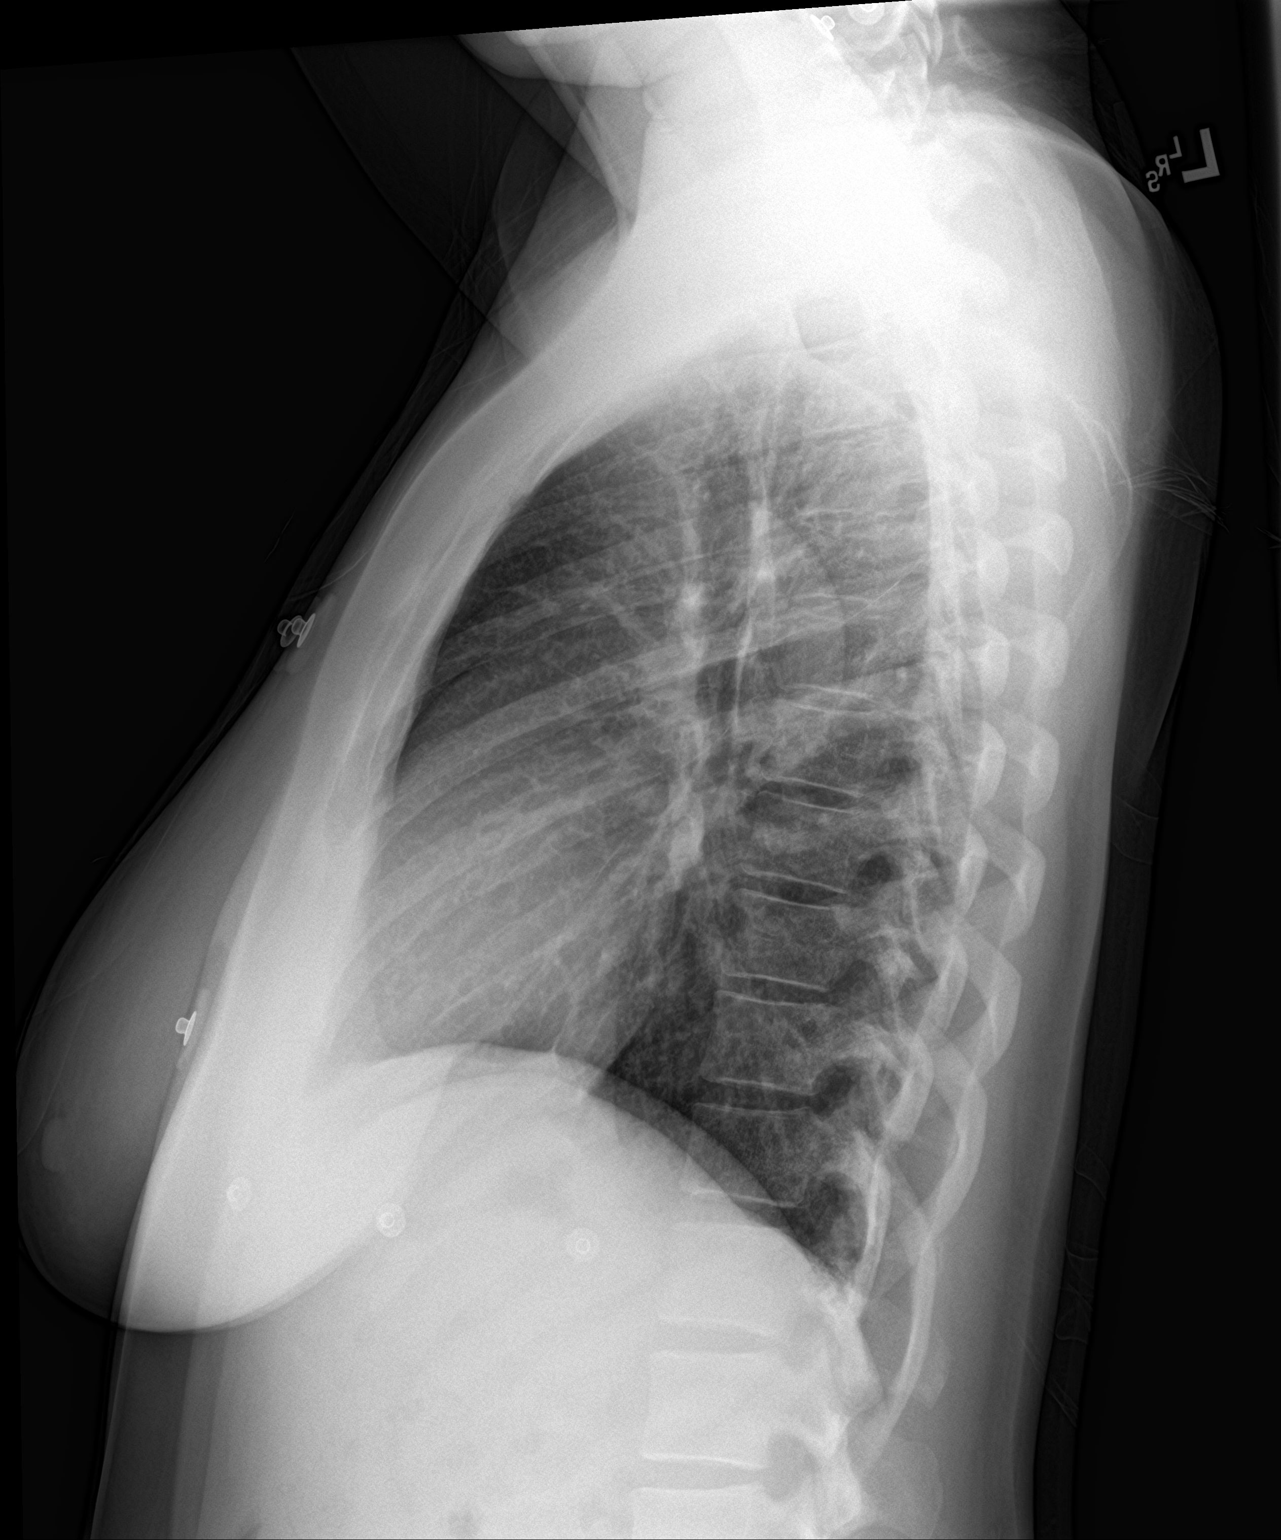

[2 of 2 positions shown; findings below may reference images not displayed]

FINDINGS: The cardiomediastinal contours are normal. The lungs are clear.
Pulmonary vasculature is normal. No consolidation, pleural effusion,
or pneumothorax. No acute osseous abnormalities are seen.
IMPRESSION: Negative radiographs of the chest.

## 2019-02-20 IMAGING — US US PELVIS COMPLETE TRANSABD/TRANSVAG
1 series · 13 of 25 positions shown · non-contrast
Comparison: Pelvic ultrasound 01/23/2010

CLINICAL DATA: Vaginal bleeding.

EXAM:
TRANSABDOMINAL AND TRANSVAGINAL ULTRASOUND OF PELVIS
TECHNIQUE: Both transabdominal and transvaginal ultrasound examinations of the
pelvis were performed. Transabdominal technique was performed for
global imaging of the pelvis including uterus, ovaries, adnexal
regions, and pelvic cul-de-sac. It was necessary to proceed with
endovaginal exam following the transabdominal exam to visualize the
ovaries and adnexa.

[Series 1: us pelvis complete transabd/transvag · 0.21mm/px · 13 of 82 slices shown]
[im 1/82]
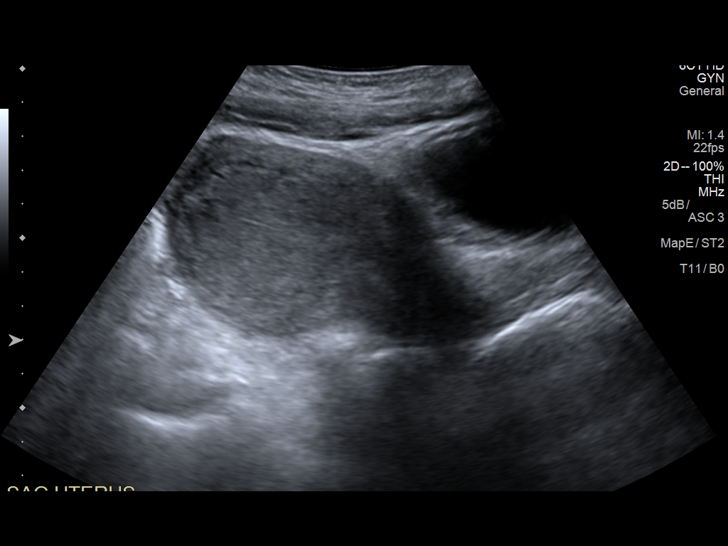
[im 7/82]
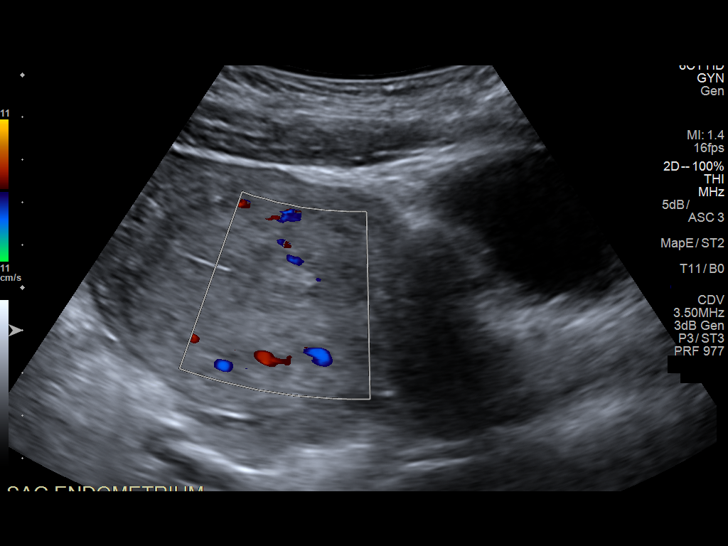
[im 14/82]
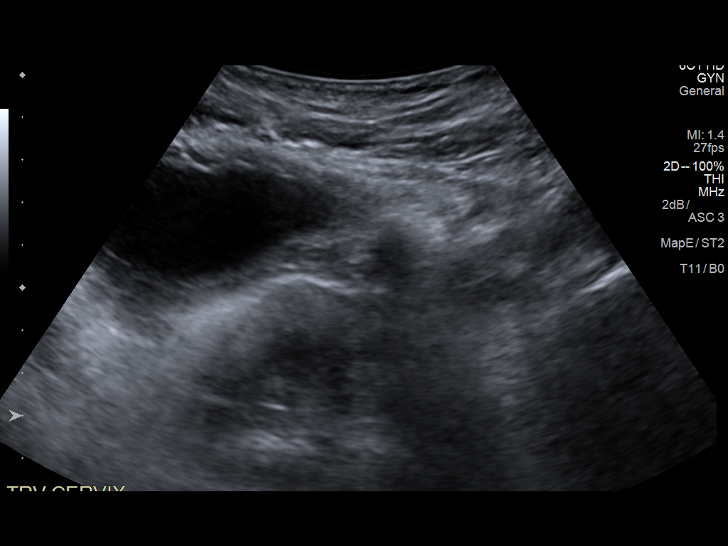
[im 21/82]
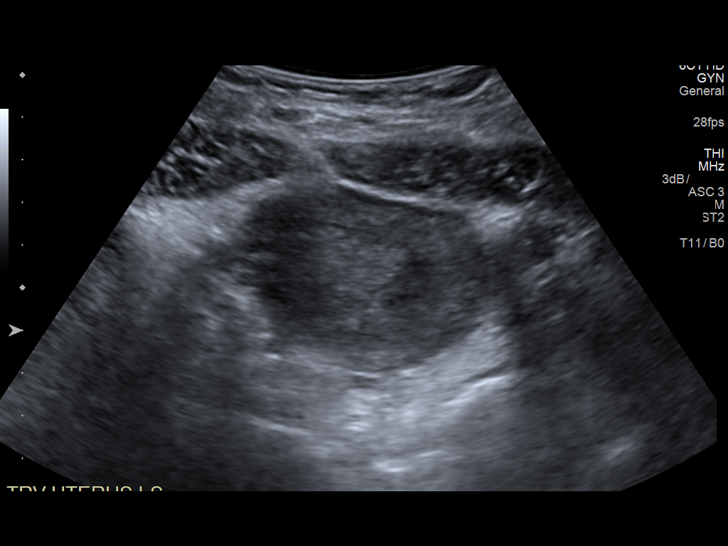
[im 28/82]
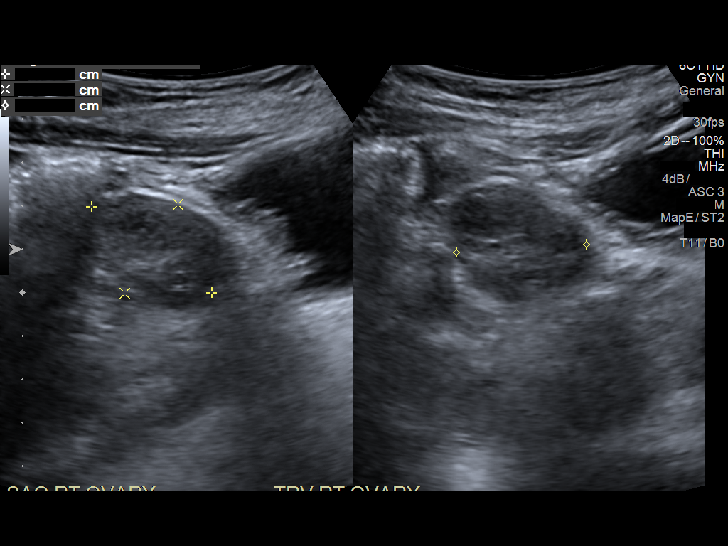
[im 34/82]
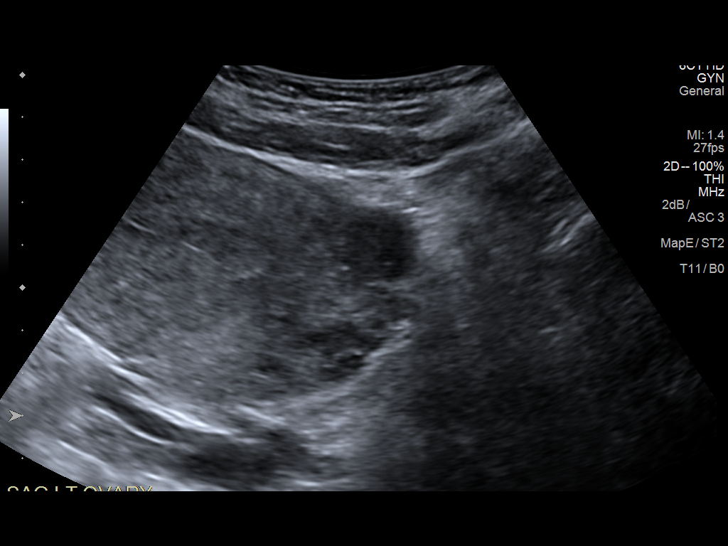
[im 41/82]
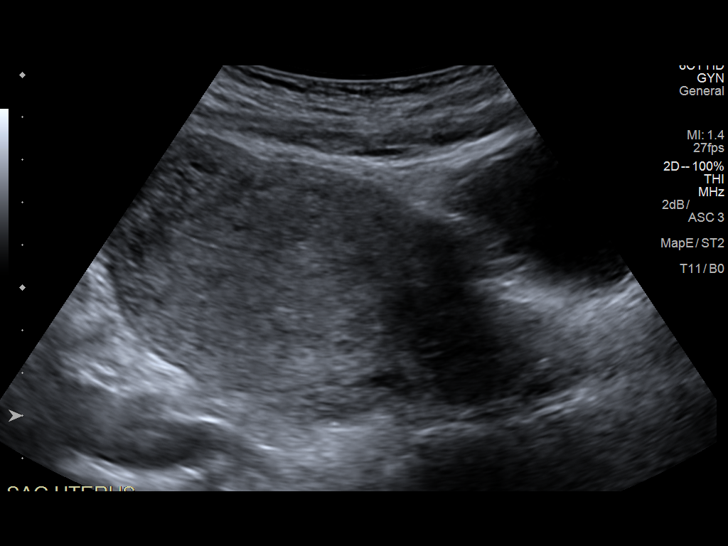
[im 48/82]
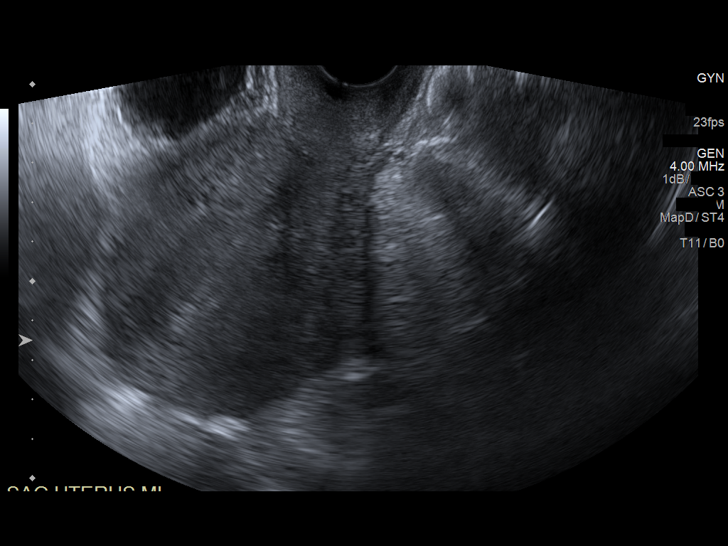
[im 55/82]
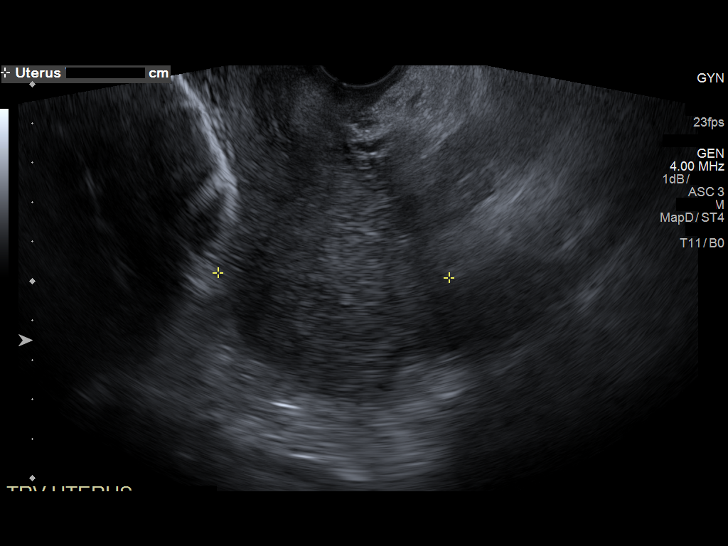
[im 61/82]
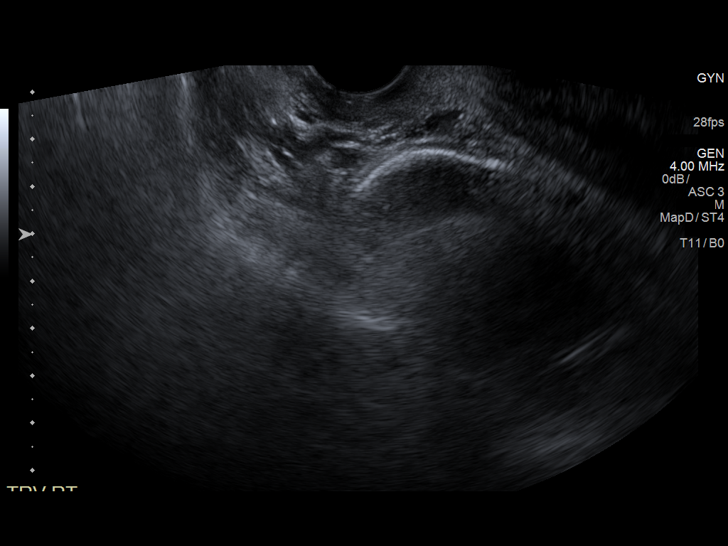
[im 68/82]
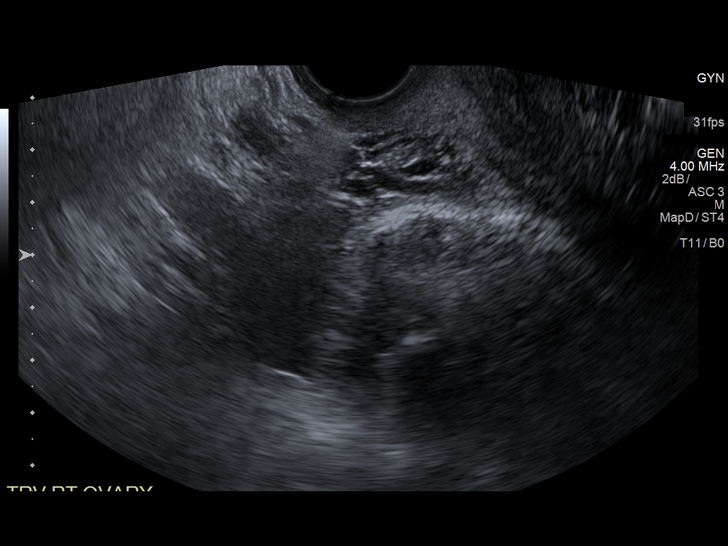
[im 75/82]
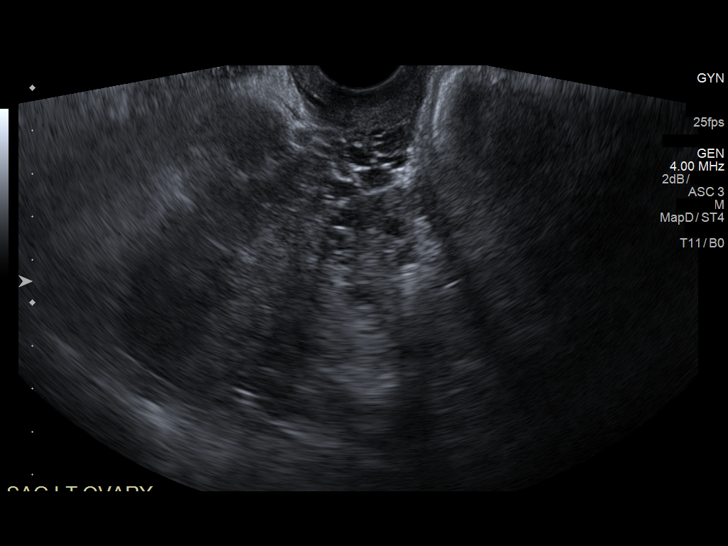
[im 82/82]
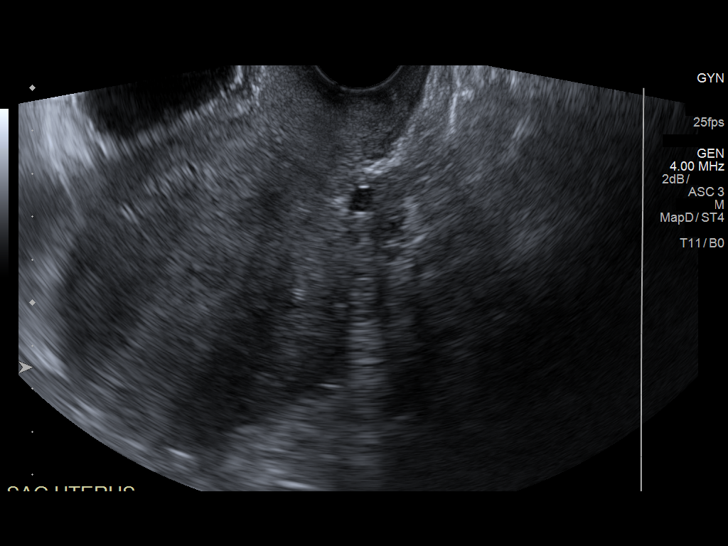

[13 of 25 positions shown; findings below may reference images not displayed]

FINDINGS: Uterus

Measurements: 10.8 x 5.8 x 6.9 cm. Echogenic 1.3 x 0.6 x 1.2 cm
lesion anteriorly either within or adjacent to the endometrium.
Posterior fundal fibroid measures 2.4 cm.

Endometrium

Thickness: 19 mm. Ovoid echogenic 1.3 cm lesion either within or
immediately anterior to the endometrium on transabdominal exam
cannot be characterized transvaginally.

Right ovary

Measurements: 3.4 x 2.4 x 3.0 cm. Normal appearance/no adnexal mass.

Left ovary

Measurements: 3.9 x 1.7 x 1.9 cm. Normal appearance/no adnexal mass.

Other findings

No abnormal free fluid.
IMPRESSION: 1. Endometrial thickening with possible endometrial polyp versus
fibroid at the endometrial myometrial junction. Given possible focal
lesion, consider further evaluation with sonohysterogram for
confirmation prior to hysteroscopy. Endometrial sampling should also
be considered if patient is at high risk for endometrial carcinoma.
(Ref: Radiological Reasoning: Algorithmic Workup of Abnormal Vaginal
Bleeding with Endovaginal Sonography and Sonohysterography. AJR
4228; 191:S68-73)
2. Posterior fundal fibroid measuring 2.4 cm.
3. Normal sonographic appearance of the ovaries.

## 2020-09-06 ENCOUNTER — Other Ambulatory Visit: Payer: Self-pay

## 2021-12-13 DIAGNOSIS — E559 Vitamin D deficiency, unspecified: Secondary | ICD-10-CM

## 2021-12-13 DIAGNOSIS — E663 Overweight: Secondary | ICD-10-CM

## 2021-12-13 HISTORY — DX: Vitamin D deficiency, unspecified: E55.9

## 2021-12-13 HISTORY — DX: Overweight: E66.3

## 2021-12-15 DIAGNOSIS — D649 Anemia, unspecified: Secondary | ICD-10-CM | POA: Diagnosis present

## 2023-05-21 ENCOUNTER — Inpatient Hospital Stay (HOSPITAL_COMMUNITY)
Admission: EM | Admit: 2023-05-21 | Discharge: 2023-05-24 | DRG: 812 | Disposition: A | Discharge status: Home / Self Care | Payer: Medicaid Other | Attending: Family Medicine | Admitting: Family Medicine

## 2023-05-21 ENCOUNTER — Encounter (HOSPITAL_COMMUNITY): Payer: Self-pay | Admitting: Family Medicine

## 2023-05-21 ENCOUNTER — Other Ambulatory Visit: Payer: Self-pay

## 2023-05-21 DIAGNOSIS — D500 Iron deficiency anemia secondary to blood loss (chronic): Secondary | ICD-10-CM | POA: Diagnosis present

## 2023-05-21 DIAGNOSIS — D696 Thrombocytopenia, unspecified: Secondary | ICD-10-CM | POA: Diagnosis present

## 2023-05-21 DIAGNOSIS — Z8249 Family history of ischemic heart disease and other diseases of the circulatory system: Secondary | ICD-10-CM

## 2023-05-21 DIAGNOSIS — E8721 Acute metabolic acidosis: Secondary | ICD-10-CM | POA: Diagnosis present

## 2023-05-21 DIAGNOSIS — Z91013 Allergy to seafood: Secondary | ICD-10-CM

## 2023-05-21 DIAGNOSIS — D259 Leiomyoma of uterus, unspecified: Secondary | ICD-10-CM | POA: Diagnosis present

## 2023-05-21 DIAGNOSIS — K047 Periapical abscess without sinus: Secondary | ICD-10-CM | POA: Diagnosis present

## 2023-05-21 DIAGNOSIS — D649 Anemia, unspecified: Principal | ICD-10-CM | POA: Diagnosis present

## 2023-05-21 DIAGNOSIS — I10 Essential (primary) hypertension: Secondary | ICD-10-CM | POA: Diagnosis present

## 2023-05-21 DIAGNOSIS — N920 Excessive and frequent menstruation with regular cycle: Secondary | ICD-10-CM | POA: Diagnosis present

## 2023-05-21 DIAGNOSIS — M546 Pain in thoracic spine: Secondary | ICD-10-CM | POA: Diagnosis present

## 2023-05-21 DIAGNOSIS — R42 Dizziness and giddiness: Secondary | ICD-10-CM

## 2023-05-21 DIAGNOSIS — Z8614 Personal history of Methicillin resistant Staphylococcus aureus infection: Secondary | ICD-10-CM | POA: Diagnosis not present

## 2023-05-21 HISTORY — DX: Anemia, unspecified: D64.9

## 2023-05-21 LAB — DIFFERENTIAL
Abs Immature Granulocytes: 0.02 10*3/uL (ref 0.00–0.07)
Basophils Absolute: 0 10*3/uL (ref 0.0–0.1)
Basophils Relative: 1 %
Eosinophils Absolute: 0.1 10*3/uL (ref 0.0–0.5)
Eosinophils Relative: 2 %
Immature Granulocytes: 0 %
Lymphocytes Relative: 31 %
Lymphs Abs: 1.7 10*3/uL (ref 0.7–4.0)
Monocytes Absolute: 0.3 10*3/uL (ref 0.1–1.0)
Monocytes Relative: 5 %
Neutro Abs: 3.2 10*3/uL (ref 1.7–7.7)
Neutrophils Relative %: 61 %

## 2023-05-21 LAB — CBC
HCT: 17.3 % — ABNORMAL LOW (ref 36.0–46.0)
HCT: 16.6 % — ABNORMAL LOW (ref 36.0–46.0)
Hemoglobin: 4.3 g/dL — CL (ref 12.0–15.0)
Hemoglobin: 4.1 g/dL — CL (ref 12.0–15.0)
MCH: 16.1 pg — ABNORMAL LOW (ref 26.0–34.0)
MCH: 16 pg — ABNORMAL LOW (ref 26.0–34.0)
MCHC: 24.9 g/dL — ABNORMAL LOW (ref 30.0–36.0)
MCHC: 24.7 g/dL — ABNORMAL LOW (ref 30.0–36.0)
MCV: 64.8 fL — ABNORMAL LOW (ref 80.0–100.0)
MCV: 64.6 fL — ABNORMAL LOW (ref 80.0–100.0)
Platelets: 34 10*3/uL — ABNORMAL LOW (ref 150–400)
Platelets: 30 10*3/uL — ABNORMAL LOW (ref 150–400)
RBC: 2.67 MIL/uL — ABNORMAL LOW (ref 3.87–5.11)
RBC: 2.57 MIL/uL — ABNORMAL LOW (ref 3.87–5.11)
RDW: 27.2 % — ABNORMAL HIGH (ref 11.5–15.5)
RDW: 27.4 % — ABNORMAL HIGH (ref 11.5–15.5)
WBC: 5.8 10*3/uL (ref 4.0–10.5)
WBC: 5.3 10*3/uL (ref 4.0–10.5)
nRBC: 0 % (ref 0.0–0.2)
nRBC: 0 % (ref 0.0–0.2)

## 2023-05-21 LAB — FERRITIN: Ferritin: 2 ng/mL — ABNORMAL LOW (ref 11–307)

## 2023-05-21 LAB — RETIC PANEL
Immature Retic Fract: 22.6 % — ABNORMAL HIGH (ref 2.3–15.9)
RBC.: 2.56 MIL/uL — ABNORMAL LOW (ref 3.87–5.11)
Retic Count, Absolute: 21.2 10*3/uL (ref 19.0–186.0)
Retic Ct Pct: 0.8 % (ref 0.4–3.1)
Reticulocyte Hemoglobin: 22.3 pg — ABNORMAL LOW (ref >27.9)

## 2023-05-21 LAB — HEPATIC FUNCTION PANEL
ALT: 29 U/L (ref 0–44)
AST: 19 U/L (ref 15–41)
Albumin: 3.1 g/dL — ABNORMAL LOW (ref 3.5–5.0)
Alkaline Phosphatase: 74 U/L (ref 38–126)
Bilirubin, Direct: <0.1 mg/dL (ref 0.0–0.2)
Total Bilirubin: 0.5 mg/dL (ref 0.3–1.2)
Total Protein: 6.8 g/dL (ref 6.5–8.1)

## 2023-05-21 LAB — BASIC METABOLIC PANEL
Anion gap: 12 (ref 5–15)
BUN: 8 mg/dL (ref 6–20)
CO2: 19 mmol/L — ABNORMAL LOW (ref 22–32)
Calcium: 8.7 mg/dL — ABNORMAL LOW (ref 8.9–10.3)
Chloride: 107 mmol/L (ref 98–111)
Creatinine, Ser: 0.64 mg/dL (ref 0.44–1.00)
GFR, Estimated: >60 mL/min (ref >60)
Glucose, Bld: 99 mg/dL (ref 70–99)
Potassium: 3.6 mmol/L (ref 3.5–5.1)
Sodium: 138 mmol/L (ref 135–145)

## 2023-05-21 LAB — TECHNOLOGIST SMEAR REVIEW: Plt Morphology: DECREASED

## 2023-05-21 LAB — FIBRINOGEN: Fibrinogen: 314 mg/dL (ref 210–475)

## 2023-05-21 LAB — IRON AND TIBC
Iron: 84 ug/dL (ref 28–170)
Saturation Ratios: 18 % (ref 10.4–31.8)
TIBC: 472 ug/dL — ABNORMAL HIGH (ref 250–450)
UIBC: 388 ug/dL

## 2023-05-21 LAB — HCG, SERUM, QUALITATIVE: Preg, Serum: NEGATIVE

## 2023-05-21 LAB — LACTATE DEHYDROGENASE: LDH: 130 U/L (ref 98–192)

## 2023-05-21 LAB — VITAMIN B12: Vitamin B-12: 409 pg/mL (ref 180–914)

## 2023-05-21 LAB — APTT: aPTT: 29 s (ref 24–36)

## 2023-05-21 LAB — PROTIME-INR
INR: 1.1 (ref 0.8–1.2)
Prothrombin Time: 14.2 s (ref 11.4–15.2)

## 2023-05-21 LAB — CBG MONITORING, ED: Glucose-Capillary: 88 mg/dL (ref 70–99)

## 2023-05-21 LAB — PREPARE RBC (CROSSMATCH)

## 2023-05-21 MED ORDER — SODIUM CHLORIDE 0.9% IV SOLUTION: Freq: Once | INTRAVENOUS | Status: AC

## 2023-05-21 NOTE — Discharge Instructions (Addendum)
Dear Kiara [name],  Thank you for letting us participate in your care. You were hospitalized for symptomatic anemia requiring blood transfusions and diagnosed with Anemia. You were treated with 3 blood transfusions and 2 IV iron infusions. We started you on Megace 40 mg twice daily until you follow up with Gynecology to discuss further options for your fibroids and heavy menstrual periods.  Start the TXA we sent home with IF you start your period on the day your period starts for 5 total days   POST-HOSPITAL & CARE INSTRUCTIONS IF your period starts, please take the Tranexamic Acid for 5 days ONLY and follow up with the family medicine practice if there are signs of heavy bleeding.  Your new PCP will be Dr. Ivery Quale, however we all work together in the family medicine residency clinic and see each other's patients if one of [place] isn't available, so your next appointment will be with one of her co-residents Dr. [name] Dolly since Dr. Threasa Beards was not available next week. Please take your Decadron 8 mg twice daily for 7 days, this a steroid that should help with your low platelets. After the 7-day course, we will have to taper you off the medication.  Go to your follow up appointments (listed below)  DOCTOR'S APPOINTMENT   Future Appointments  Date Time Provider Department Center  05/30/2023  9:20 AM Lockie Mola, MD [facility]     Take care and be well!  Family Medicine Teaching Service Inpatient Team [place]  [facility]  [address] [place], [place] [address] [phone]

## 2023-05-21 NOTE — ED Triage Notes (Signed)
Patient arrives with multiple complaints: dizziness with movement, such as rolling over in bed or bending over to tie a shoe, swollen ankles if she stands for an extended amount of time, and an abscess on her gum. Had appointment with PCP but came here instead.

## 2023-05-21 NOTE — Hospital Course (Addendum)
Kiara [name] is a 47 y.o.female with a history of iron deficiency anemia and menorrhagia requiring prior transfusions who was admitted to the [facility] Medicine Teaching Service at [facility] for symptomatic anemia. Her hospital course is detailed below:  Anemia Patient experiencing worsening fatigue and lightheadedness over the past week, no signs of active bleeding at presentation. Initial workup indicated patient's Hgb critically low at 4.1. Iron panel indicated low ferritin of 2, iron 84, elevated TIBC of 472. B12 normal at 409. Patient received 3 units of pRBC, with Hgb at discharge being 8.0. No signs of bleeding throughout hospital stay. S/p 3 IV infusions, a total of 750 mg. Started Megace 40 mg BID per GYN until follow up with GYN for her menorrhagia and upcoming menstrual cycle. Advised patient to take 5 days of TXA if her menstrual cycle starts despite being on Megace.   Thrombocytopenia Initial workup indicated low platelet count of 30k, worsened to 25k. No active signs of bleeding at presentation. Per hematology, since thrombocytopenia persists, will start Decadron 8 mg BID x 7 days and Protonix. Will taper Decadron after 7 days and d/c Protonix.    PCP Follow Up Recommendations Dr. Threasa Beards wants to be PCP, please message the inpatient team if patient comes in for hospital follow up. Taper Decadron after 7 days (9/13-.9/19) Started patient on iron supplementation for her chronic anemia and menorrhagia. CBC, iron studies and ferritin monthly for the next 3 months per hematology.  Follow up with Dr. Si Gaul at [facility] for further recommendations for anemia, thrombocytopenia, and to discuss ongoing need for more iron infusion outpatient with onc.  Repeat pelvic US in 6-12 weeks from 9/11 to assess for resolution of possible hemorrhagic cyst recommended by radiologist, but it could be due to where she is in her menstrual cycle. Reconsider if menorrhagia continues and if there is  concern.  Referral to GYN outpatient to discuss options for her fibroids and OCP vs Megace continuation with menorrhagia. GYN may consider a endometrial biopsy as well outpatient.

## 2023-05-21 NOTE — H&P (Cosign Needed Addendum)
Hospital Admission History and Physical Service Pager: [phone]  Patient name: Kiara [name] Medical record number: [id number] Date of Birth: 05/14/1976 Age: 47 y.o. Gender: female  Primary Care Provider: Patient, No Pcp Per Consultants: None Code Status: Full code Preferred Emergency Contact:  Contact Information     Name Relation Home Work [place] Son   [phone]      Other Contacts     Name Relation Home Work Mobile   [facility]  [phone]     [facility] Significant other   [phone]   Carlyle Dolly   [phone]        Chief Complaint: Dizziness and fatigue  Assessment and Plan: Kiara [name] is a 47 y.o. female pmh significant for anemia and heavy menstrual bleeding requiring prior transfusions presenting with dizziness and fatigue and found to have microcytic anemia and thrombocytopenia. Differential for presentation of this includes iron deficiency anemia, GI bleed, myelodysplastic syndrome (B12 normal), hemoglobinopathies, ITP, TTP, DIC. Most likely iron deficient anemia given patient history, heavy menstrual bleeding, and not taking iron supplements recently. Less likely TTP, MAHA, HUS or DIC given lack of current bleeding symptoms/bruising, normal kidney function, normal LDH and reticulocyte ct but blood smear and coag labs and haptoglobin pending.   Assessment & Plan Anemia, unspecified type Patient's Hgb critically low at 4.1 today. Iron panel indicates low ferritin of 2, iron 84, elevated TIBC of 472. B12 normal at 409. Likely iron deficient anemia secondary to heavy menstrual bleeding. No active signs of bleeding currently and labs without evidence of hemolysis.  - admit to FMTS, attending Dr. Manson Passey - Receiving 2 unit pRBC in ED - post H&H - am CBC, BMP - vitals per floor routine - will need to restart iron supplementation - work up thrombocytopenia as below  Thrombocytopenia (HCC) Low platelet count of 30.  Although rare, could be associated with IDA.  Will consider ITP especially in setting of menorrhagia, also with history of epistaxis although very mild. - Pending coag panel, blood smear, HIV, UA, haptoglobin - am CBC - Transfuse platelets for <10k or <50k with signs of bleeding  Chronic and Stable Problems:  Dental abscess: patient currently taking clindamycin and ibuprofen for pain as directed by outside provider  FEN/GI: Regular diet  VTE Prophylaxis: SCDs  Disposition: Admit to FMTS   History of Present Illness:  Kiara [name] is a 47 y.o. female presenting with dizziness and weakness. Patient has been feeling worsening weakness/fatigue over the past week. She started feeling very lightheaded at rest yesterday, but has been able to ambulate without any recent falls or LOC. She has some mild SOB with exertion. No vision changes, no chest pain or palpitations. Patient had a 4/10 headache about 1 week ago that was worse with lying flat and self-resolved.   Patient has history of heavy menstrual bleeding with clots, with bleeding lasting for 8-10 days. LMP mid August, no current or breakthrough bleeding. Previously, she was prescribed iron supplements but she has not been able to take these in the last year due to cost. She occasionally has mild epistaxis with blowing nose, last episode months ago. No current bleeding from anywhere, no easy or new bruising, no blood in urine or stools. No dysuria. No recent fever, chills, night sweats.   Over the last week, patient noticed ankles have been swelling intermittently, worsened by standing for prolonged periods and relieved with resting and elevating her feet.   Patient is currently taking clindamycin  300 mg q8h for dental abscess - she previously took about a week of this course, stopped for a week due to GI upset, and resumed course about 3 days ago.  In the ED, patient remained hemodynamically stable and able to ambulate independently. Patient  is to receive 2 units pRBC.   Review Of Systems: Per HPI   Pertinent Past Medical History: Anemia Heavy menstrual bleeding  MRSA infection of finger Remainder reviewed in history tab.   Pertinent Past Surgical History: C- section Debridement for MRSA infection of finger ~20 years ago Remainder reviewed in history tab.   Pertinent Social History: Tobacco use: Never Alcohol use: None Other Substance use: Marijuana Lives with her fiance and son   Pertinent Family History: Mother - diabetes, issues with blood (unsure of diagnosis, had to go to hospital multiple times) Father - heart problem and diabetes  Remainder reviewed in history tab.   Important Outpatient Medications: Clindamycin 300 TID for dental abscess  Remainder reviewed in medication history.   Objective: BP 134/79   Pulse 96   Temp 99.4 F (37.4 C) (Oral)   Resp (!) 21   Ht 5\' 3"  (1.6 m)   Wt 72.6 kg   LMP 04/27/2023 (Exact Date)   SpO2 100%   BMI 28.34 kg/m  Exam: General: Tired-appearing, alert and oriented Eyes: Sclera without injection or icterus.  ENTM: MMM. Cardiovascular: Normal S1/S2. No extra heart sounds. Warm and well-perfused. Respiratory: Breathing comfortably on room air. Lung fields CTAB. No increased WOB. Gastrointestinal: Soft, non-tender, non-distended. MSK: No extremity edema noted. Derm: No visible bruising. Psych: Pleasant and appropriate.  Labs:  CBC BMET  Recent Labs  Lab 05/21/23 2021  WBC 5.3  HGB 4.1*  HCT 16.6*  PLT 30*   Recent Labs  Lab 05/21/23 1635  NA 138  K 3.6  CL 107  CO2 19*  BUN 8  CREATININE 0.64  GLUCOSE 99  CALCIUM 8.7*     Lab Results  Component Value Date   IRON 84 05/21/2023   TIBC 472 (H) 05/21/2023   FERRITIN 2 (L) 05/21/2023   Lab Results  Component Value Date   VITAMINB12 409 05/21/2023   Lab Results  Component Value Date   RETICCTPCT 0.8 05/21/2023   LDH 130  EKG: No ischemic changes   Imaging Studies  Performed: None   Ivery Quale, MD 05/21/2023, 10:20 PM PGY-1, [facility]  FPTS Intern pager: [phone], text pages welcome Secure chat group [facility] Teaching Service   I was personally present and re-performed the exam and medical decision making and verified the service and findings are accurately documented in the intern's note.  Erick Alley, DO 05/21/2023 11:43 PM

## 2023-05-21 NOTE — Assessment & Plan Note (Addendum)
Patient's Hgb critically low at 4.1 today. Iron panel indicates low ferritin of 2, normal iron 84 and TSAT 18, elevated TIBC of 472. B12 normal at 409. Likely component of iron deficient anemia secondary to heavy menstrual bleeding. Can also consider thalassemia as TSAT and iron are normal. No active signs of bleeding currently and labs without evidence of hemolysis.  - admit to FMTS, attending Dr. Manson Passey - Receiving 2 unit pRBC in ED - post H&H - hemoglobin fractionation cascade added on to labs drawn prior to blood transfusion  - am CBC, BMP - vitals per floor routine - will need to restart iron supplementation - work up thrombocytopenia as below

## 2023-05-21 NOTE — ED Notes (Signed)
Patient ambulated to the restroom independently  

## 2023-05-21 NOTE — ED Notes (Signed)
ED TO INPATIENT HANDOFF REPORT  ED Nurse Name and Phone #: Cat [phone]  S Name/Age/Gender Kiara [name] 47 y.o. female Room/Bed: 046C/046C  Code Status   Code Status: Full Code  Home/SNF/Other Home Patient oriented to: self, place, time, and situation Is this baseline? Yes   Triage Complete: Triage complete  Chief Complaint Anemia [D64.9]  Triage Note Patient arrives with multiple complaints: dizziness with movement, such as rolling over in bed or bending over to tie a shoe, swollen ankles if she stands for an extended amount of time, and an abscess on her gum. Had appointment with PCP but came here instead.    Allergies Allergies  Allergen Reactions   Shellfish Allergy Rash    Level of Care/Admitting Diagnosis ED Disposition     ED Disposition  Admit   Condition  --   Comment  Hospital Area: [facility] [[id number]]  Level of Care: Med-Surg [16]  May admit patient to [facility] or [facility] if equivalent level of care is available:: No  Covid Evaluation: Asymptomatic - no recent exposure (last 10 days) testing not required  Diagnosis: Anemia [454098]  Admitting Physician: Westley Chandler [[id number]]  Attending Physician: Westley Chandler [[id number]]  Certification:: I certify this patient will need inpatient services for at least 2 midnights  Expected Medical Readiness: 05/24/2023          B Medical/Surgery History No past medical history on file. No past surgical history on file.   A IV Location/Drains/Wounds Patient Lines/Drains/Airways Status     Active Line/Drains/Airways     Name Placement date Placement time Site Days   Peripheral IV 05/21/23 20 G 1.88" Left;Anterior;Proximal Forearm 05/21/23  1911  Forearm  less than 1            Intake/Output Last 24 hours No intake or output data in the 24 hours ending 05/21/23 2219  Labs/Imaging Results for orders placed or performed during the hospital encounter of 05/21/23 (from  the past 48 hour(s))  CBG monitoring, ED     Status: None   Collection Time: 05/21/23  4:32 PM  Result Value Ref Range   Glucose-Capillary 88 70 - 99 mg/dL    Comment: Glucose reference range applies only to samples taken after fasting for at least 8 hours.  Basic metabolic panel     Status: Abnormal   Collection Time: 05/21/23  4:35 PM  Result Value Ref Range   Sodium 138 135 - 145 mmol/L   Potassium 3.6 3.5 - 5.1 mmol/L   Chloride 107 98 - 111 mmol/L   CO2 19 (L) 22 - 32 mmol/L   Glucose, Bld 99 70 - 99 mg/dL    Comment: Glucose reference range applies only to samples taken after fasting for at least 8 hours.   BUN 8 6 - 20 mg/dL   Creatinine, Ser 0.86 0.44 - 1.00 mg/dL   Calcium 8.7 (L) 8.9 - 10.3 mg/dL   GFR, Estimated >57 >84 mL/min    Comment: (NOTE) Calculated using the CKD-EPI Creatinine Equation (2021)    Anion gap 12 5 - 15    Comment: Performed at [facility], [address], [place], [place] [address]  CBC     Status: Abnormal   Collection Time: 05/21/23  4:35 PM  Result Value Ref Range   WBC 5.8 4.0 - 10.5 K/uL   RBC 2.67 (L) 3.87 - 5.11 MIL/uL   Hemoglobin 4.3 (LL) 12.0 - 15.0 g/dL  Comment: REPEATED TO VERIFY Reticulocyte Hemoglobin testing may be clinically indicated, consider ordering this additional test [id number] THIS CRITICAL RESULT HAS VERIFIED AND BEEN CALLED TO S MCNEILL,RN BY WALTER BOND ON 09 10 2024 AT 1810, AND HAS BEEN READ BACK.     HCT 17.3 (L) 36.0 - 46.0 %   MCV 64.8 (L) 80.0 - 100.0 fL   MCH 16.1 (L) 26.0 - 34.0 pg   MCHC 24.9 (L) 30.0 - 36.0 g/dL   RDW 95.2 (H) 84.1 - 32.4 %   Platelets 34 (L) 150 - 400 K/uL    Comment: SPECIMEN CHECKED FOR CLOTS Immature Platelet Fraction may be clinically indicated, consider ordering this additional test [id number] REPEATED TO VERIFY    nRBC 0.0 0.0 - 0.2 %    Comment: Performed at [facility], [address] [place], [place] [address]  hCG, serum, qualitative     Status: None    Collection Time: 05/21/23  4:35 PM  Result Value Ref Range   Preg, Serum NEGATIVE NEGATIVE    Comment:        THE SENSITIVITY OF THIS METHODOLOGY IS >10 mIU/mL. Performed at [facility], [address] [place], [place] [address]   Prepare RBC (crossmatch)     Status: None   Collection Time: 05/21/23  7:26 PM  Result Value Ref Range   Order Confirmation      ORDER PROCESSED BY BLOOD BANK Performed at [facility], [address] [place], [place] [address]   Type and screen [facility]     Status: None (Preliminary result)   Collection Time: 05/21/23  7:26 PM  Result Value Ref Range   ABO/RH(D) A POS    Antibody Screen NEG    Sample Expiration 05/24/2023,2359    Unit Number [id number]    Blood Component Type RBC LR PHER2    Unit division 00    Status of Unit ISSUED    Transfusion Status OK TO TRANSFUSE    Crossmatch Result      Compatible Performed at [facility], [address], [place], [place] [address]    Unit Number [id number]    Blood Component Type RED CELLS,LR    Unit division 00    Status of Unit ALLOCATED    Transfusion Status OK TO TRANSFUSE    Crossmatch Result Compatible   Differential     Status: None   Collection Time: 05/21/23  8:21 PM  Result Value Ref Range   Neutrophils Relative % 61 %   Neutro Abs 3.2 1.7 - 7.7 K/uL   Lymphocytes Relative 31 %   Lymphs Abs 1.7 0.7 - 4.0 K/uL   Monocytes Relative 5 %   Monocytes Absolute 0.3 0.1 - 1.0 K/uL   Eosinophils Relative 2 %   Eosinophils Absolute 0.1 0.0 - 0.5 K/uL   Basophils Relative 1 %   Basophils Absolute 0.0 0.0 - 0.1 K/uL   Immature Granulocytes 0 %   Abs Immature Granulocytes 0.02 0.00 - 0.07 K/uL    Comment: Performed at [facility], [address] [place], [place] [address]  Hepatic function panel     Status: Abnormal   Collection Time: 05/21/23  8:21 PM  Result Value Ref Range   Total Protein 6.8 6.5 - 8.1 g/dL   Albumin 3.1 (L) 3.5 - 5.0  g/dL   AST 19 15 - 41 U/L   ALT 29 0 - 44 U/L  Alkaline Phosphatase 74 38 - 126 U/L   Total Bilirubin 0.5 0.3 - 1.2 mg/dL   Bilirubin, Direct <1.6 0.0 - 0.2 mg/dL   Indirect Bilirubin NOT CALCULATED 0.3 - 0.9 mg/dL    Comment: Performed at [facility], [address], [place], [place] [address]  Ferritin     Status: Abnormal   Collection Time: 05/21/23  8:21 PM  Result Value Ref Range   Ferritin 2 (L) 11 - 307 ng/mL    Comment: Performed at [facility], [address] [place], [place] [address]  Iron and TIBC     Status: Abnormal   Collection Time: 05/21/23  8:21 PM  Result Value Ref Range   Iron 84 28 - 170 ug/dL   TIBC 811 (H) 914 - 782 ug/dL   Saturation Ratios 18 10.4 - 31.8 %   UIBC 388 ug/dL    Comment: Performed at [facility], [address] [place], [place] [address]  Vitamin B12     Status: None   Collection Time: 05/21/23  8:21 PM  Result Value Ref Range   Vitamin B-12 409 180 - 914 pg/mL    Comment: (NOTE) This assay is not validated for testing neonatal or myeloproliferative syndrome specimens for Vitamin B12 levels. Performed at [facility], [address] [place], [place] [address]   Lactate dehydrogenase     Status: None   Collection Time: 05/21/23  8:21 PM  Result Value Ref Range   LDH 130 98 - 192 U/L    Comment: Performed at [facility], [address] [place], [place] [address]  CBC     Status: Abnormal   Collection Time: 05/21/23  8:21 PM  Result Value Ref Range   WBC 5.3 4.0 - 10.5 K/uL   RBC 2.57 (L) 3.87 - 5.11 MIL/uL   Hemoglobin 4.1 (LL) 12.0 - 15.0 g/dL    Comment: CRITICAL VALUE NOTED.  VALUE IS CONSISTENT WITH PREVIOUSLY REPORTED AND CALLED VALUE. REPEATED TO VERIFY Reticulocyte Hemoglobin testing may be clinically indicated, consider ordering this additional test [id number]    HCT 16.6 (L) 36.0 - 46.0 %   MCV 64.6 (L) 80.0 - 100.0 fL   MCH 16.0 (L) 26.0 - 34.0 pg   MCHC 24.7 (L) 30.0 - 36.0 g/dL    RDW 32.4 (H) 40.1 - 15.5 %   Platelets 30 (L) 150 - 400 K/uL    Comment: Immature Platelet Fraction may be clinically indicated, consider ordering this additional test [id number] REPEATED TO VERIFY    nRBC 0.0 0.0 - 0.2 %    Comment: Performed at [facility], [address], [place], [place] [address]  Retic Panel     Status: Abnormal   Collection Time: 05/21/23  8:21 PM  Result Value Ref Range   Retic Ct Pct 0.8 0.4 - 3.1 %   RBC. 2.56 (L) 3.87 - 5.11 MIL/uL   Retic Count, Absolute 21.2 19.0 - 186.0 K/uL   Immature Retic Fract 22.6 (H) 2.3 - 15.9 %   Reticulocyte Hemoglobin 22.3 (L) >27.9 pg    Comment:        A RET-He < 28 pg is an indication of iron-deficient or iron- insufficient erythropoiesis. Patients with thalassemia may also have a decreased RET-He result unrelated to iron availability.     If this patient has chronic kidney disease and does not have a hemoglobinopathy he/she meets criteria for iron deficiency per the 2016  NICE guidelines. Refer to specific guidelines to determine the appropriate thresholds for treating CKD- associated iron deficiency. TSAT and ferritin should be used in patients with hemoglobinopathies (e.g. thalassemia). Performed at [facility], [address], [place], [place] [address]    No results found.  Pending Labs Unresulted Labs (From admission, onward)     Start     Ordered   05/22/23 0500  CBC  Tomorrow morning,   R        05/21/23 2138   05/22/23 0500  Basic metabolic panel  Tomorrow morning,   R        05/21/23 2138   05/21/23 2132  HIV Antibody (routine testing w rflx)  (HIV Antibody (Routine testing w reflex) panel)  Once,   R        05/21/23 2138   05/21/23 2100  APTT  Add-on,   AD        05/21/23 2059   05/21/23 2100  Fibrinogen  Add-on,   AD        05/21/23 2059   05/21/23 2100  Protime-INR  Add-on,   AD        05/21/23 2059   05/21/23 2018  Haptoglobin  ONCE - URGENT,   URGENT        05/21/23 2019    05/21/23 2017  Technologist smear review  (Technologist smear review)  ONCE - STAT,   STAT       Question:  Clinical information:  Answer:  microcytic anemia with plt ct 34   05/21/23 2019   05/21/23 1345  Urinalysis, Routine w reflex microscopic -Urine, Clean Catch  Once,   URGENT       Question:  Specimen Source  Answer:  Urine, Clean Catch   05/21/23 1344            Vitals/Pain Today's Vitals   05/21/23 2030 05/21/23 2128 05/21/23 2135 05/21/23 2150  BP: 135/73  (!) 147/89 (!) 141/81  Pulse: 99  94 90  Resp: (!) 26  20 12   Temp:  99.3 F (37.4 C)  99.4 F (37.4 C)  TempSrc:  Oral  Oral  SpO2: 100%     Weight:      Height:      PainSc:        Isolation Precautions No active isolations  Medications Medications  0.9 %  sodium chloride infusion (Manually program via Guardrails IV Fluids) ( Intravenous New Bag/Given 05/21/23 2138)    Mobility walks     Focused Assessments     R Recommendations: See Admitting Provider Note  Report given to:   Additional Notes:

## 2023-05-21 NOTE — ED Provider Notes (Signed)
[place] EMERGENCY DEPARTMENT AT [facility] Provider Note   CSN: [id number] Arrival date & time: 05/21/23  1245     History  Chief Complaint  Patient presents with   Dizziness    Kiara [name] is a 47 y.o. female.  Patient complains of dizziness.  Patient reports she has a history of low hemoglobin.  Patient has not been taking her iron supplement.  Patient reports she has increased dizziness when she is bending over.  Patient reports she was recently diagnosed with a dental infection and is currently on antibiotics.  Patient denies any fever or chills she has not had any cough or congestion.  Patient denies any abdominal pain.  Patient reports that she has heavy periods.  Patient reports that she has a long history of anemia.  The history is provided by the patient. No language interpreter was used.  Dizziness      Home Medications Prior to Admission medications   Medication Sig Start Date End Date Taking? Authorizing Provider  ferrous sulfate 325 (65 FE) MG tablet Take 1 tablet (325 mg total) by mouth 2 (two) times daily with a meal. 06/16/18   Geoffery Lyons, MD      Allergies    Patient has no known allergies.    Review of Systems   Review of Systems  Neurological:  Positive for dizziness.  All other systems reviewed and are negative.   Physical Exam Updated Vital Signs BP 102/80 (BP Location: Right Arm)   Pulse 100   Temp 99.2 F (37.3 C) (Oral)   Resp 18   Ht 5\' 3"  (1.6 m)   Wt 72.6 kg   LMP 04/27/2023 (Exact Date)   SpO2 100%   BMI 28.34 kg/m  Physical Exam Vitals and nursing note reviewed.  Constitutional:      Appearance: She is well-developed.  HENT:     Head: Normocephalic.     Mouth/Throat:     Mouth: Mucous membranes are moist.  Eyes:     Comments: Pale conjunctiva  Cardiovascular:     Rate and Rhythm: Normal rate.  Pulmonary:     Effort: Pulmonary effort is normal.  Abdominal:     General: There is no distension.   Musculoskeletal:        General: Normal range of motion.     Cervical back: Normal range of motion.  Skin:    General: Skin is warm.  Neurological:     Mental Status: She is alert and oriented to person, place, and time.     ED Results / Procedures / Treatments   Labs (all labs ordered are listed, but only abnormal results are displayed) Labs Reviewed  BASIC METABOLIC PANEL - Abnormal; Notable for the following components:      Result Value   CO2 19 (*)    Calcium 8.7 (*)    All other components within normal limits  CBC - Abnormal; Notable for the following components:   RBC 2.67 (*)    Hemoglobin 4.3 (*)    HCT 17.3 (*)    MCV 64.8 (*)    MCH 16.1 (*)    MCHC 24.9 (*)    RDW 27.2 (*)    Platelets 34 (*)    All other components within normal limits  HCG, SERUM, QUALITATIVE  URINALYSIS, ROUTINE W REFLEX MICROSCOPIC  CBG MONITORING, ED  PREPARE RBC (CROSSMATCH)    EKG None  Radiology No results found.  Procedures .Critical Care  Performed by: Keenan Bachelor,  Lonia Skinner, PA-C Authorized by: Elson Areas, PA-C   Critical care provider statement:    Critical care time (minutes):  30   Critical care start time:  05/21/2023 6:30 PM   Critical care end time:  05/21/2023 8:06 PM   Critical care time was exclusive of:  Separately billable procedures and treating other patients and teaching time   Critical care was necessary to treat or prevent imminent or life-threatening deterioration of the following conditions:  Cardiac failure and circulatory failure   Critical care was time spent personally by me on the following activities:  Blood draw for specimens, development of treatment plan with patient or surrogate, discussions with consultants, examination of patient, review of old charts, pulse oximetry and ordering and review of laboratory studies   Care discussed with: admitting provider       Medications Ordered in ED Medications  0.9 %  sodium chloride infusion (Manually  program via Guardrails IV Fluids) (has no administration in time range)    ED Course/ Medical Decision Making/ A&P Clinical Course as of 05/21/23 1821  Tue May 21, 2023  1816 Hemoglobin(!!): 4.3 [JL]    Clinical Course User Index [JL] Ernie Avena, MD                                 Medical Decision Making Patient complains of feeling dizzy.  Patient reports dizziness is worse when she bends over.  Has a history of heavy periods and anemia  Amount and/or Complexity of Data Reviewed Labs: ordered. Decision-making details documented in ED Course.    Details: Labs ordered reviewed and interpreted.  Pregnancy test is negative patient's hemoglobin returned and is 4.3 Discussion of management or test interpretation with external provider(s): Unassigned medicine consulted.  Family practice we will see for admission  Risk Prescription drug management. Risk Details: Patient counseled on anemia.  Patient is agreeable to blood transfusion. Transfusion ordered  Critical Care Total time providing critical care: 30 minutes           Final Clinical Impression(s) / ED Diagnoses Final diagnoses:  Anemia, unspecified type  Dizziness    Rx / DC Orders ED Discharge Orders     None         Osie Cheeks 05/21/23 2007    Ernie Avena, MD 05/22/23 1158

## 2023-05-21 NOTE — ED Provider Triage Note (Signed)
Emergency Medicine Provider Triage Evaluation Note  Kiara [name] , a 46 y.o. female  was evaluated in triage.  Pt complains of intermittent dizziness with vision changes since yesterday morning.  Associated right sided frontotemporal headache.  No vision changes.  No chest pain or shortness of breath.  No recent nausea, vomiting, or diarrhea.  No history of the symptoms.  No head injury.  No recent fever, cough, or congestion.  Review of Systems  Positive: See HPI Negative: See HPI  Physical Exam  BP (!) 161/87 (BP Location: Right Arm)   Pulse (!) 111   Temp 98.6 F (37 C) (Oral)   Resp (!) 21   Ht 5\' 3"  (1.6 m)   Wt 72.6 kg   LMP 04/27/2023 (Exact Date)   SpO2 100%   BMI 28.34 kg/m  Gen:   Awake, no distress   Resp:  Normal effort lungs clear to auscultation MSK:   Moves extremities without difficulty no lower extremity edema Other:  Tachycardia with regular rhythm, abdomen soft nontender, neurologically intact  Medical Decision Making  Medically screening exam initiated at 1:44 PM.  Appropriate orders placed.  Belen CINDIA FREGOSO was informed that the remainder of the evaluation will be completed by another provider, this initial triage assessment does not replace that evaluation, and the importance of remaining in the ED until their evaluation is complete.     Tonette Lederer, PA-C 05/21/23 1345

## 2023-05-22 ENCOUNTER — Inpatient Hospital Stay (HOSPITAL_COMMUNITY): Payer: Medicaid Other

## 2023-05-22 ENCOUNTER — Encounter (HOSPITAL_COMMUNITY): Payer: Self-pay | Admitting: Family Medicine

## 2023-05-22 DIAGNOSIS — D696 Thrombocytopenia, unspecified: Secondary | ICD-10-CM | POA: Diagnosis not present

## 2023-05-22 DIAGNOSIS — D649 Anemia, unspecified: Secondary | ICD-10-CM

## 2023-05-22 DIAGNOSIS — R42 Dizziness and giddiness: Secondary | ICD-10-CM

## 2023-05-22 LAB — CBC
HCT: 20.7 % — ABNORMAL LOW (ref 36.0–46.0)
Hemoglobin: 6 g/dL — CL (ref 12.0–15.0)
MCH: 20.6 pg — ABNORMAL LOW (ref 26.0–34.0)
MCHC: 29 g/dL — ABNORMAL LOW (ref 30.0–36.0)
MCV: 71.1 fL — ABNORMAL LOW (ref 80.0–100.0)
Platelets: 25 10*3/uL — CL (ref 150–400)
RBC: 2.91 MIL/uL — ABNORMAL LOW (ref 3.87–5.11)
RDW: 29.2 % — ABNORMAL HIGH (ref 11.5–15.5)
WBC: 5.5 10*3/uL (ref 4.0–10.5)
nRBC: 0 % (ref 0.0–0.2)

## 2023-05-22 LAB — BASIC METABOLIC PANEL
Anion gap: 9 (ref 5–15)
BUN: 6 mg/dL (ref 6–20)
CO2: 21 mmol/L — ABNORMAL LOW (ref 22–32)
Calcium: 8.6 mg/dL — ABNORMAL LOW (ref 8.9–10.3)
Chloride: 109 mmol/L (ref 98–111)
Creatinine, Ser: 0.61 mg/dL (ref 0.44–1.00)
GFR, Estimated: >60 mL/min (ref >60)
Glucose, Bld: 100 mg/dL — ABNORMAL HIGH (ref 70–99)
Potassium: 3.5 mmol/L (ref 3.5–5.1)
Sodium: 139 mmol/L (ref 135–145)

## 2023-05-22 LAB — URINALYSIS, ROUTINE W REFLEX MICROSCOPIC
Bilirubin Urine: NEGATIVE
Glucose, UA: NEGATIVE mg/dL
Hgb urine dipstick: NEGATIVE
Ketones, ur: 5 mg/dL — AB
Leukocytes,Ua: NEGATIVE
Nitrite: NEGATIVE
Protein, ur: NEGATIVE mg/dL
Specific Gravity, Urine: 1.011 (ref 1.005–1.030)
pH: 7 (ref 5.0–8.0)

## 2023-05-22 LAB — PREPARE RBC (CROSSMATCH)

## 2023-05-22 LAB — HIV ANTIBODY (ROUTINE TESTING W REFLEX): HIV Screen 4th Generation wRfx: NONREACTIVE

## 2023-05-22 MED ORDER — SODIUM CHLORIDE 0.9 % IV SOLN: 62.5000 mg | Freq: Once | INTRAVENOUS | Status: DC

## 2023-05-22 MED ORDER — ACETAMINOPHEN 500 MG PO TABS
1000.0000 mg | ORAL_TABLET | Freq: Four times a day (QID) | ORAL | Status: DC | PRN
  Administered 2023-05-22 – 2023-05-23 (×4): 1000 mg via ORAL
  Filled 2023-05-22 (×4): qty 2

## 2023-05-22 MED ORDER — SODIUM CHLORIDE 0.9% IV SOLUTION
Freq: Once | INTRAVENOUS | Status: AC
  Administered 2023-05-22: 10 mL via INTRAVENOUS

## 2023-05-22 MED ORDER — SODIUM CHLORIDE 0.9 % IV SOLN
250.0000 mg | Freq: Once | INTRAVENOUS | Status: AC
  Administered 2023-05-22: 250 mg via INTRAVENOUS
  Filled 2023-05-22: qty 20

## 2023-05-22 NOTE — Plan of Care (Signed)
Problem: Education: Goal: Knowledge of General Education information will improve Description: Including pain rating scale, medication(s)/side effects and non-pharmacologic comfort measures Outcome: Progressing Pt understands she was admitted into the hospital d/t fatigue consistent with symptomatic iron deficiency anemia from chronic blood loss and thrombocytopenia.    Problem: Clinical Measurements: Goal: Ability to maintain clinical measurements within normal limits will improve Outcome: Progressing Pt VS WNL this shift.   Problem: Clinical Measurements: Goal: Will remain free from infection Outcome: Progressing S/Sx of infection monitored and assessed q8 hours.  Pt has remained afebrile thus far.          Problem: Clinical Measurements: Goal: Respiratory complications will improve Outcome: Progressing Respiratory status monitored and assessed q-shift.  Pt is on room air with PO2 at 100% and respiration rate of 18 breaths per minute.  Pt hs not endorsed c/o SOB or DOE.    Problem: Clinical Measurements: Goal: Cardiovascular complication will be avoided Outcome: Progressing Pt VS WNL this shift.    Problem: Activity: Goal: Risk for activity intolerance will decrease Outcome: Progressing Pt is able to perform ADLs independently.  She needs the assistance of RN staff to help her OOB d/t fatigue and dizziness  consistent with symptomatic iron deficiency anemia from chronic blood loss and thrombocytopenia.     Problem: Nutrition: Goal: Adequate nutrition will be maintained Outcome: Progressing Pt is on a regular diet.  She has been able to tolerate her diet w/o s/sx of abdominal pain/ distention or n/v.    Problem: Coping: Goal: Level of anxiety will decrease Outcome: Progressing Pt states she is anxious about her hospitalization and questions why her H/H has not improved after 3 units of PRBCs.  MD and RN staff offered support and presence to pt as needed.  Reassured pt that  medical staff are doing all they can to assess the situation.     Problem: Safety: Goal: Ability to remain free from injury will improve Outcome: Progressing Pt has remained free from falls thus far.  Instructed pt to utilize RN call light for assistance.  Hourly rounds performed.  Bed in lowest position, locked with two upper side rails engaged.  Belongings and call light within reach.    Problem: Skin Integrity: Goal: Risk for impaired skin integrity will decrease Outcome: Progressing Skin integrity monitored and assessed q-shift.  Instructed pt to turn q2 hours to prevent further skin impairment.  Tubes and drains assessed for device related pressure sores.  Pt is continent of both bowel and bladder.

## 2023-05-22 NOTE — Progress Notes (Signed)
[facility] [phone]   [phone]  CONSULT NOTE  REFERRING PHYSICIAN: Dr. Burley Saver  REASON FOR CONSULTATION:  47 years old African-American female with severe anemia.  HPI Kiara [name] is a 47 y.o. female with past medical history significant for hypertension.  The patient also has a history of anemia that has been going on for many years secondary to heavy menstrual cycles and fibroid uterus.  The patient presented to the emergency department complaining of increasing fatigue and weakness as well as dizzy spells.  She had CBC performed yesterday and that showed hemoglobin was down to 4.3, hematocrit 17.3% with MCV of 64.8.  Her platelets count also was very low at 74,000.  Ferritin level was 2.  Iron studies showed normal serum iron of 54 with iron saturation of 15% and total iron binding capacity of 472.  She had normal vitamin B12 and LDH.  The patient received 3 units of PRBCs transfusion.  She also received iron infusion with ferric gluconate 250 mg today.  Previous blood work in 2019 showed similar findings with hemoglobin was down to 5.2, hematocrit 21% with MCV of 68.  Her platelets count at that time was elevated at 569 secondary to the iron deficiency.  When seen today she is feeling much better with less fatigue.  She denied having any chest pain, shortness of breath, cough or hemoptysis.  She has no nausea, vomiting, diarrhea or constipation.  She denied having any recent history of bleeding, bruises or ecchymosis.  She has no recent weight loss or night sweats. Family history significant for mother and sister with history of anemia.  Father had heart disease. The patient is single and has 4 children.  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  He has no history of smoking, alcohol or drug abuse.  HPI  Past Medical History:  Diagnosis Date   Anemia     History reviewed. No pertinent surgical history.  Family History  Problem Relation  Age of Onset   Anemia Mother    Heart attack Father 15    Social History Social History   Tobacco Use   Smoking status: Never   Smokeless tobacco: Never  Vaping Use   Vaping status: Never Used  Substance Use Topics   Alcohol use: Never   Drug use: Never    Allergies  Allergen Reactions   Shellfish Allergy Rash    Current Facility-Administered Medications  Medication Dose Route Frequency Provider Last Rate Last Admin   acetaminophen (TYLENOL) tablet 1,000 mg  1,000 mg Oral Q6H PRN Ivery Quale, MD   1,000 mg at 05/22/23 1403    Review of Systems  Constitutional: positive for fatigue Eyes: negative Ears, nose, mouth, throat, and face: negative Respiratory: negative Cardiovascular: negative Gastrointestinal: negative Genitourinary:negative Integument/breast: negative Hematologic/lymphatic: negative Musculoskeletal:negative Neurological: negative Behavioral/Psych: negative Endocrine: negative Allergic/Immunologic: negative  Physical Exam  CZY:SAYTK, healthy, no distress, well nourished, and well developed SKIN: skin color, texture, turgor are normal, no rashes or significant lesions HEAD: Normocephalic, No masses, lesions, tenderness or abnormalities EYES: normal, PERRLA, Conjunctiva are pink and non-injected EARS: External ears normal, Canals clear OROPHARYNX:no exudate, no erythema, and lips, buccal mucosa, and tongue normal  NECK: supple, no adenopathy, no JVD LYMPH:  no palpable lymphadenopathy, no hepatosplenomegaly BREAST:not examined LUNGS: clear to auscultation , and palpation HEART: regular rate & rhythm, no murmurs, and no gallops ABDOMEN:abdomen soft, non-tender, normal bowel sounds, and no masses or organomegaly BACK: Back symmetric,  no curvature., No CVA tenderness EXTREMITIES:no joint deformities, effusion, or inflammation, no edema  NEURO: alert & oriented x 3 with fluent speech, no focal motor/sensory deficits  PERFORMANCE STATUS: ECOG  1  LABORATORY DATA: Lab Results  Component Value Date   WBC 5.5 05/22/2023   HGB 6.0 (LL) 05/22/2023   HCT 20.7 (L) 05/22/2023   MCV 71.1 (L) 05/22/2023   PLT 25 (LL) 05/22/2023    @LASTCHEM @  RADIOGRAPHIC STUDIES: No results found.  ASSESSMENT: This is a very pleasant 47 years old African-American female presented with severe anemia secondary to severe iron deficiency with ferritin level down to 2.  This is secondary to heavy menstrual loss as her.  Usually last between 7-10 days with some clotting.  She has a history of uterine fibroid. Patient also has thrombocytopenia of unclear etiology but could be secondary to nutritional deficiency, blood loss and less likely ITP.  There is no finding concerning for TTP in this patient with normal LDH, renal function and absence of neurological symptoms and fever.  No schistocytes on the blood smear.  PLAN: I had a lengthy discussion with the patient today about her condition and treatment options.  There is no indication for hemolysis at this point. The patient received 3 units of PRBCs transfusion.  She was also treated with ferrous gluconate IV infusion. I recommended for the patient to proceed with iron infusion daily for 3 more doses for a total of 1000 mg. Will continue to monitor her platelets count closely.  If there is no improvement in the platelet count in the next few days, I may consider The patient for trial of steroids or IVIG. The patient will need to start on oral iron supplement after discharge.  She will need close monitoring with repeat blood work including CBC, iron study and ferritin monthly for the next 3 months with the primary care team. The patient voices understanding of current disease status and treatment options and is in agreement with the current care plan.  All questions were answered. The patient knows to call the clinic with any problems, questions or concerns. We can certainly see the patient much sooner if  necessary.  Thank you so much for allowing me to participate in the care of Kiara [name]. I will continue to follow up the patient with you and assist in her care.   Disclaimer: This note was dictated with voice recognition software. Similar sounding words can inadvertently be transcribed and may not be corrected upon review.   Lajuana Matte May 22, 2023, 3:41 PM

## 2023-05-22 NOTE — Assessment & Plan Note (Addendum)
Low platelet count of 30>25. PT/PTT 14.2/29, INR 1.1. Although rare, could be associated with IDA. Blood smear demonstrated teardrop cells. HIV and UA negative. Hematology agreed with our plan and agrees its likely secondary to IDA or nutrition. Considered ITP especially in setting of menorrhagia, also with history of epistaxis although very mild. Can consider bone marrow pathology, myelodysplastic syndrome. - Haptoglobin pending - AM CBC - Transfuse platelets for <10k or <50k with signs of bleeding - Hematology consulted, appreciate recs

## 2023-05-22 NOTE — Progress Notes (Signed)
Daily Progress Note Intern Pager: [phone]  Patient name: Kiara [name] Medical record number: [id number] Date of birth: 06/17/1976 Age: 47 y.o. Gender: female  Primary Care Provider: Patient, No Pcp Per Consultants: Hematology Code Status: Full Code   Pt Overview and Major Events to Date:  9/10: Admitted + 2u pRBC 9/11: 1u pRBC  Assessment and Plan: Kiara [name] is a 47 year old female with past medical history significant for anemia and menorrhagia requiring blood transfusions presenting with severe microcytic anemia and thrombocytopenia. Assessment & Plan Anemia Patient's Hgb critically low at 4.3>4.1>6.0 s/p 2 units of pRBC. Iron panel indicates low ferritin of 2, normal iron 84 and TSAT 18, elevated TIBC of 472. B12 normal at 409. LDH 130, Retic count 21.2. Likely component of IDA secondary to heavy menstrual bleeding. Consider thalassemia as TSAT and iron are normal. No active signs of bleeding currently and labs without evidence of hemolysis. Blood smear demonstrated teardrop cells. Ganzoni equation for IDA shows deficit of 674 mg. - Ordered 1 unit pRBC - post H&H - hemoglobin fractionation pending - IV Ferrlecit 250 mg - AM CBC/BMP - Transvaginal US pending Thrombocytopenia (HCC) Low platelet count of 30>25. PT/PTT 14.2/29, INR 1.1. Although rare, could be associated with IDA. Blood smear demonstrated teardrop cells. HIV and UA negative. Hematology agreed with our plan and agrees its likely secondary to IDA or nutrition. Considered ITP especially in setting of menorrhagia, also with history of epistaxis although very mild. Can consider bone marrow pathology, myelodysplastic syndrome. - Haptoglobin pending - AM CBC - Transfuse platelets for <10k or <50k with signs of bleeding - Hematology consulted, appreciate recs  Chronic and Stable Problems: Dental Abscess: on Clindamycin  FEN/GI: Regular Diet PPx: SCDs Dispo: pending clinical improvement  Subjective:   Patient doing well this morning and feels like her weakness is improved since yesterday. Continues to have some   Endorses having diarrhea with some consistency yesterday and some over the course of the past few days, denies it being severely watery and associates it to clindamycin.  She denies any chest pain, shortness of breath, abdominal pain, blood in stool, nausea, vomiting.  Objective: Temp:  [98.6 F (37 C)-100.4 F (38 C)] 98.8 F (37.1 C) (09/11 0832) Pulse Rate:  [72-111] 72 (09/11 0832) Resp:  [11-26] 18 (09/11 0832) BP: (102-161)/(73-95) 127/80 (09/11 0832) SpO2:  [100 %] 100 % (09/11 0832) Weight:  [72.6 kg] 72.6 kg (09/10 1333) Physical Exam: General: Alert and awake in NAD. Cardiovascular: RRR. No M/R/G. Respiratory: CTAB. Normal WOB on RA. No wheezing, crackles, or rhonchi Abdomen: Soft, non-tender, non-distended Extremities: No BLE edema Neuro: AAOx3. No focal neurological deficits.  Laboratory: Most recent CBC Lab Results  Component Value Date   WBC 5.5 05/22/2023   HGB 6.0 (LL) 05/22/2023   HCT 20.7 (L) 05/22/2023   MCV 71.1 (L) 05/22/2023   PLT 25 (LL) 05/22/2023   Most recent BMP    Latest Ref Rng & Units 05/22/2023    5:48 AM  BMP  Glucose 70 - 99 mg/dL 308   BUN 6 - 20 mg/dL 6   Creatinine 6.57 - 8.46 mg/dL 9.62   Sodium 952 - 841 mmol/L 139   Potassium 3.5 - 5.1 mmol/L 3.5   Chloride 98 - 111 mmol/L 109   CO2 22 - 32 mmol/L 21   Calcium 8.9 - 10.3 mg/dL 8.6     Imaging/Diagnostic Tests: None.  Kiara Curling, DO 05/22/2023, 10:33 AM  PGY-1, [facility] FPTS Intern pager: [phone], text pages welcome Secure chat group [facility] Teaching Service

## 2023-05-22 NOTE — Assessment & Plan Note (Addendum)
Patient's Hgb critically low at 4.3>4.1>6.0 s/p 2 units of pRBC. Iron panel indicates low ferritin of 2, normal iron 84 and TSAT 18, elevated TIBC of 472. B12 normal at 409. LDH 130, Retic count 21.2. Likely component of IDA secondary to heavy menstrual bleeding. Consider thalassemia as TSAT and iron are normal. No active signs of bleeding currently and labs without evidence of hemolysis. Blood smear demonstrated teardrop cells. Ganzoni equation for IDA shows deficit of 674 mg. - Ordered 1 unit pRBC - post H&H - hemoglobin fractionation pending - IV Ferrlecit 250 mg - AM CBC/BMP - Transvaginal US pending

## 2023-05-22 NOTE — Assessment & Plan Note (Signed)
Low platelet count of 30. Although rare, could be associated with IDA.  Will consider ITP especially in setting of menorrhagia, also with history of epistaxis although very mild. Can consider bone marrow pathology, myelodysplastic syndrome.  - Pending coag panel (BUT drawn AFTER first RBC transfusion began) - f/u blood smear, HIV, UA, haptoglobin - am CBC - Transfuse platelets for <10k or <50k with signs of bleeding

## 2023-05-23 DIAGNOSIS — D649 Anemia, unspecified: Secondary | ICD-10-CM | POA: Diagnosis not present

## 2023-05-23 LAB — BASIC METABOLIC PANEL WITH GFR
Anion gap: 8 (ref 5–15)
BUN: 12 mg/dL (ref 6–20)
CO2: 20 mmol/L — ABNORMAL LOW (ref 22–32)
Calcium: 8.5 mg/dL — ABNORMAL LOW (ref 8.9–10.3)
Chloride: 108 mmol/L (ref 98–111)
Creatinine, Ser: 0.6 mg/dL (ref 0.44–1.00)
GFR, Estimated: >60 mL/min
Glucose, Bld: 105 mg/dL — ABNORMAL HIGH (ref 70–99)
Potassium: 3.9 mmol/L (ref 3.5–5.1)
Sodium: 136 mmol/L (ref 135–145)

## 2023-05-23 LAB — TYPE AND SCREEN
ABO/RH(D): A POS
Antibody Screen: NEGATIVE
Unit division: 0
Unit division: 0
Unit division: 0

## 2023-05-23 LAB — CBC
HCT: 25 % — ABNORMAL LOW (ref 36.0–46.0)
Hemoglobin: 7.3 g/dL — ABNORMAL LOW (ref 12.0–15.0)
MCH: 21 pg — ABNORMAL LOW (ref 26.0–34.0)
MCHC: 29.2 g/dL — ABNORMAL LOW (ref 30.0–36.0)
MCV: 72 fL — ABNORMAL LOW (ref 80.0–100.0)
Platelets: 25 10*3/uL — CL (ref 150–400)
RBC: 3.47 MIL/uL — ABNORMAL LOW (ref 3.87–5.11)
RDW: 29.5 % — ABNORMAL HIGH (ref 11.5–15.5)
WBC: 6 10*3/uL (ref 4.0–10.5)
nRBC: 0 % (ref 0.0–0.2)

## 2023-05-23 LAB — BPAM RBC
Blood Product Expiration Date: 202409252359
Blood Product Expiration Date: 202410062359
Blood Product Expiration Date: 202410072359
ISSUE DATE / TIME: 202409102113
ISSUE DATE / TIME: 202409102328
ISSUE DATE / TIME: 202409110759
Unit Type and Rh: 6200
Unit Type and Rh: 6200
Unit Type and Rh: 6200

## 2023-05-23 MED ORDER — MEGESTROL ACETATE 40 MG PO TABS
40.0000 mg | ORAL_TABLET | Freq: Two times a day (BID) | ORAL | Status: DC
  Administered 2023-05-23 – 2023-05-24 (×3): 40 mg via ORAL
  Filled 2023-05-23 (×4): qty 1

## 2023-05-23 MED ORDER — SODIUM CHLORIDE 0.9 % IV SOLN
250.0000 mg | Freq: Every day | INTRAVENOUS | Status: DC
  Administered 2023-05-23 – 2023-05-24 (×2): 250 mg via INTRAVENOUS
  Filled 2023-05-23 (×2): qty 20

## 2023-05-23 MED ORDER — LIDOCAINE 5 % EX PTCH
1.0000 | MEDICATED_PATCH | Freq: Every day | CUTANEOUS | Status: DC
  Administered 2023-05-23 – 2023-05-24 (×2): 1 via TRANSDERMAL
  Filled 2023-05-23 (×3): qty 1

## 2023-05-23 NOTE — Plan of Care (Signed)

## 2023-05-23 NOTE — Progress Notes (Signed)
Daily Progress Note Intern Pager: [phone]  Patient name: Kiara [name] Medical record number: [id number] Date of birth: [birth year] Age: 47 y.o. Gender: female  Primary Care Provider: Patient, No Pcp Per Consultants: Hematology Code Status: Full Code  Pt Overview and Major Events to Date:  9/10: Admitted + 2u pRBC 9/11: 1u pRBC + IV Ferrlecit 250 mg  Assessment and Plan: Kiara [name] is a 47 year old female with past medical history significant for anemia and menorrhagia requiring blood transfusions presenting with severe microcytic anemia and thrombocytopenia. Assessment & Plan Anemia Patient's Hgb critically low at 4.3>4.1>6.0>7.3 s/p 3 units of pRBC. Iron infusions 1000 mg per hematology. Likely component of IDA secondary to heavy menstrual bleeding. Consider thalassemia as TSAT and iron are normal. No active signs of bleeding currently and labs without evidence of hemolysis. Ganzoni equation for IDA shows deficit of 674 mg. TVUS demonstrated growth in uterine fibroids, possible hemorrhagic cyst, and 12 mm endometrial thickness.  - hemoglobin fractionation pending - IV Ferrlecit 250 mg (9/11-9/14) - Megase 40 BID continuously until GYN follow up outpatient & consider TXA x5 days if bleeding continues - AM CBC Thrombocytopenia (HCC) Low platelet count of 30>25>25. PT/PTT 14.2/29, INR 1.1. Although rare, could be associated with IDA. Blood smear demonstrated teardrop cells. HIV and UA negative. Hematology agreed with our plan and agrees its likely secondary to IDA or nutrition. Considered ITP especially in setting of menorrhagia, also with history of epistaxis although very mild. Can consider bone marrow pathology, myelodysplastic syndrome. - Haptoglobin pending - AM CBC - Transfuse platelets for <10k or <50k with signs of bleeding - Hematology consulted, appreciate recs  Chronic and Stable Problems: Dental Abscess: on Clindamycin, held on admission   FEN/GI:  Regular Diet PPx: SCDs Dispo: pending clinical improvement  Subjective:  Patient is doing well this morning.  She reports her back pain worsening overnight, was given heating pad and lidocaine patches which seem to help improve her symptoms.  Denies any active bleeding.  Reports that her weakness and dizziness has improved since yesterday.  Objective: Temp:  [98 F (36.7 C)-99.4 F (37.4 C)] 98 F (36.7 C) (09/12 0737) Pulse Rate:  [68-74] 69 (09/12 0737) Resp:  [18] 18 (09/12 0737) BP: (119-134)/(70-85) 119/70 (09/12 0737) SpO2:  [99 %-100 %] 100 % (09/12 0737) Weight:  [75.6 kg] 75.6 kg (09/12 0443) Physical Exam: General: Alert and awake in NAD Cardiovascular: RRR. No M/R/G. Respiratory: CTAB. Normal WOB on RA. No wheezing, crackles, or rhonchi. Abdomen: Soft, non-tender, non-distended Extremities: No BLE edema  Laboratory: Most recent CBC Lab Results  Component Value Date   WBC 6.0 05/23/2023   HGB 7.3 (L) 05/23/2023   HCT 25.0 (L) 05/23/2023   MCV 72.0 (L) 05/23/2023   PLT 25 (LL) 05/23/2023   Most recent BMP    Latest Ref Rng & Units 05/23/2023    8:50 AM  BMP  Glucose 70 - 99 mg/dL 202   BUN 6 - 20 mg/dL 12   Creatinine 5.42 - 1.00 mg/dL 7.06   Sodium 237 - 628 mmol/L 136   Potassium 3.5 - 5.1 mmol/L 3.9   Chloride 98 - 111 mmol/L 108   CO2 22 - 32 mmol/L 20   Calcium 8.9 - 10.3 mg/dL 8.5    Imaging/Diagnostic Tests: TVUS 1. Uterine fibroids have increased in size and number. 2. Heterogeneous nonvascular hypoechoic area in the right ovary measuring 2.6 cm, possible hemorrhagic cyst. Consider follow-up pelvic ultrasound in 6-12  weeks to assess for resolution. 3. Endometrial thickness of 12 mm. If bleeding remains unresponsive to hormonal or medical therapy, sonohysterogram should be considered for focal lesion work-up.  Fortunato Curling, DO 05/23/2023, 2:22 PM  PGY-1, [facility] Family Medicine FPTS Intern pager: [phone], text pages welcome Secure chat  group [facility] Teaching Service

## 2023-05-23 NOTE — Assessment & Plan Note (Addendum)
Low platelet count of 30>25>25. PT/PTT 14.2/29, INR 1.1. Although rare, could be associated with IDA. Blood smear demonstrated teardrop cells. HIV and UA negative. Hematology agreed with our plan and agrees its likely secondary to IDA or nutrition. Considered ITP especially in setting of menorrhagia, also with history of epistaxis although very mild. Can consider bone marrow pathology, myelodysplastic syndrome. - Haptoglobin pending - AM CBC - Transfuse platelets for <10k or <50k with signs of bleeding - Hematology consulted, appreciate recs

## 2023-05-23 NOTE — Progress Notes (Signed)
Consulted gynecology and spoke with Dr. Milas Hock regarding patient's anemia and thrombocytopenia and menstrual cycle starting soon.  Discussed whether Megace vs Provera should be started in a pulsatile vs continuous fashion.  She recommended that we do continuous Megace 40 mg BID until she follows up with gynecology outpatient.  No contraindication to estrogen, so OCP could be an option as well.  However, due to how soon her menstrual cycle is likely to start, Megace would be a better option.  She also recommended prescribing her a 5-day course of TXA in case Megace does not control her bleeding and she needs additional support.  Upon reviewing the TVUS, she believes that her bleeding is related to being perimenopausal and not likely from the fibroids.  Advised [place] to share with the patient that she may require an endometrial biopsy outpatient at some point as well. Appreciate the curbside consult and recommendations!  Fortunato Curling, DO [facility] Family Medicine, PGY-1 05/23/23 2:53 PM  Service pager [phone]

## 2023-05-23 NOTE — Assessment & Plan Note (Addendum)
Patient's Hgb critically low at 4.3>4.1>6.0>7.3 s/p 3 units of pRBC. Iron infusions 1000 mg per hematology. Likely component of IDA secondary to heavy menstrual bleeding. Consider thalassemia as TSAT and iron are normal. No active signs of bleeding currently and labs without evidence of hemolysis. Ganzoni equation for IDA shows deficit of 674 mg. TVUS demonstrated growth in uterine fibroids, possible hemorrhagic cyst, and 12 mm endometrial thickness.  - hemoglobin fractionation pending - IV Ferrlecit 250 mg (9/11-9/14) - Megase 40 BID continuously until GYN follow up outpatient & consider TXA x5 days if bleeding continues - AM CBC

## 2023-05-23 NOTE — Progress Notes (Signed)
Patient experiencing intermittent mid-back pain that started today. She thinks it may be from laying in the hospital bed. On exam, patient is tender to palpation of mid-thoracic vertebrates. Ordered heating pad and lidocaine patch as needed for pain relief. Day team may consider imaging as needed.

## 2023-05-24 ENCOUNTER — Other Ambulatory Visit (HOSPITAL_COMMUNITY): Payer: Self-pay

## 2023-05-24 DIAGNOSIS — D696 Thrombocytopenia, unspecified: Secondary | ICD-10-CM | POA: Diagnosis not present

## 2023-05-24 DIAGNOSIS — D649 Anemia, unspecified: Secondary | ICD-10-CM | POA: Diagnosis not present

## 2023-05-24 LAB — CBC WITH DIFFERENTIAL/PLATELET
Abs Immature Granulocytes: 0.3 10*3/uL — ABNORMAL HIGH (ref 0.00–0.07)
Basophils Absolute: 0.1 10*3/uL (ref 0.0–0.1)
Basophils Relative: 1 %
Eosinophils Absolute: 0.2 10*3/uL (ref 0.0–0.5)
Eosinophils Relative: 2 %
HCT: 27.4 % — ABNORMAL LOW (ref 36.0–46.0)
Hemoglobin: 8 g/dL — ABNORMAL LOW (ref 12.0–15.0)
Immature Granulocytes: 4 %
Lymphocytes Relative: 16 %
Lymphs Abs: 1.3 10*3/uL (ref 0.7–4.0)
MCH: 21.5 pg — ABNORMAL LOW (ref 26.0–34.0)
MCHC: 29.2 g/dL — ABNORMAL LOW (ref 30.0–36.0)
MCV: 73.7 fL — ABNORMAL LOW (ref 80.0–100.0)
Monocytes Absolute: 0.5 10*3/uL (ref 0.1–1.0)
Monocytes Relative: 5 %
Neutro Abs: 5.9 10*3/uL (ref 1.7–7.7)
Neutrophils Relative %: 72 %
Platelets: 49 10*3/uL — ABNORMAL LOW (ref 150–400)
RBC: 3.72 MIL/uL — ABNORMAL LOW (ref 3.87–5.11)
RDW: 30.5 % — ABNORMAL HIGH (ref 11.5–15.5)
Smear Review: NORMAL
WBC: 8.3 10*3/uL (ref 4.0–10.5)
nRBC: 0 % (ref 0.0–0.2)

## 2023-05-24 LAB — HGB FRACTIONATION CASCADE
Hgb A2: 1.3 % — ABNORMAL LOW (ref 1.8–3.2)
Hgb A: 98.7 % (ref 96.4–98.8)
Hgb F: 0 % (ref 0.0–2.0)
Hgb S: 0 %

## 2023-05-24 LAB — HAPTOGLOBIN: Haptoglobin: 106 mg/dL (ref 42–296)

## 2023-05-24 MED ORDER — SENNA 8.6 MG PO TABS
1.0000 | ORAL_TABLET | Freq: Every day | ORAL | Status: DC
  Administered 2023-05-24: 8.6 mg via ORAL
  Filled 2023-05-24: qty 1

## 2023-05-24 MED ORDER — TRANEXAMIC ACID 650 MG PO TABS
1300.0000 mg | ORAL_TABLET | Freq: Three times a day (TID) | ORAL | 0 refills | Status: AC
Start: 2023-05-24 — End: 2023-05-29
  Filled 2023-05-24: qty 30, 5d supply, fill #0

## 2023-05-24 MED ORDER — DEXAMETHASONE 4 MG PO TABS
8.0000 mg | ORAL_TABLET | Freq: Two times a day (BID) | ORAL | Status: DC
  Administered 2023-05-24: 8 mg via ORAL
  Filled 2023-05-24 (×2): qty 2

## 2023-05-24 MED ORDER — DEXAMETHASONE 4 MG PO TABS
8.0000 mg | ORAL_TABLET | Freq: Two times a day (BID) | ORAL | 0 refills | Status: AC
Start: 2023-05-24 — End: 2023-05-31
  Filled 2023-05-24: qty 28, 7d supply, fill #0

## 2023-05-24 MED ORDER — POLYETHYLENE GLYCOL 3350 17 G PO PACK
17.0000 g | PACK | Freq: Every day | ORAL | Status: DC
  Administered 2023-05-24: 17 g via ORAL
  Filled 2023-05-24: qty 1

## 2023-05-24 MED ORDER — FERROUS SULFATE 325 (65 FE) MG PO TABS
325.0000 mg | ORAL_TABLET | ORAL | 0 refills | Status: DC
  Filled 2023-05-24: qty 100, 200d supply, fill #0

## 2023-05-24 MED ORDER — PANTOPRAZOLE SODIUM 40 MG PO TBEC
40.0000 mg | DELAYED_RELEASE_TABLET | Freq: Every day | ORAL | Status: DC
  Administered 2023-05-24: 40 mg via ORAL
  Filled 2023-05-24: qty 1

## 2023-05-24 MED ORDER — MEGESTROL ACETATE 40 MG PO TABS
40.0000 mg | ORAL_TABLET | Freq: Two times a day (BID) | ORAL | 0 refills | Status: DC
  Filled 2023-05-24: qty 60, 30d supply, fill #0

## 2023-05-24 MED ORDER — PANTOPRAZOLE SODIUM 40 MG PO TBEC
40.0000 mg | DELAYED_RELEASE_TABLET | Freq: Every day | ORAL | 0 refills | Status: DC
Start: 2023-05-25 — End: 2023-11-15
  Filled 2023-05-24: qty 30, 30d supply, fill #0

## 2023-05-24 NOTE — Plan of Care (Signed)

## 2023-05-24 NOTE — Assessment & Plan Note (Addendum)
Patient's Hgb critically low at 4.3>4.1>6.0>7.3 s/p 3 units of pRBC. Iron infusions 1000 mg per hematology recommended. Likely component of IDA secondary to heavy menstrual bleeding. No evidence of thalassemia. No active signs of bleeding currently and labs without evidence of hemolysis. Ganzoni equation for IDA shows deficit of 674 mg. TVUS demonstrated growth in uterine fibroids, possible hemorrhagic cyst, and 12 mm endometrial thickness.  - IV Ferrlecit 250 mg (9/11-9/13) - Megase 40 BID continuously until GYN follow up outpatient & consider TXA x5 days if bleeding continues - CBC w/ diff pending - Miralax and Senna

## 2023-05-24 NOTE — Assessment & Plan Note (Addendum)
Low platelet count of 30>25>25. PT/PTT 14.2/29, INR 1.1. Haptoglobin 106. Although rare, could be associated with IDA vs drug-induced. Blood smear demonstrated teardrop cells. HIV and UA negative.  - CBC w/ diff pending - If plts remain low, Decadron 8 mg BID x7 days and then taper it slowly with Protonix daily; if plts wnl no need for Decadron and Protonix - Transfuse platelets for <10k or <50k with signs of bleeding - Hematology consulted, appreciate recs

## 2023-05-24 NOTE — Plan of Care (Signed)

## 2023-05-24 NOTE — Progress Notes (Addendum)
Daily Progress Note Intern Pager: [phone]  Patient name: Kiara [name] Medical record number: [id number] Date of birth: Sep 08, 1976 Age: 47 y.o. Gender: female  Primary Care Provider: Patient, No Pcp Per Consultants: Hematology Code Status: Full Code  Pt Overview and Major Events to Date:  9/10: Admitted + 2u pRBC 9/11: 1u pRBC + IV Ferrlecit 250 mg 9/12: IV Ferrlecit 250 mg 9/13: Started on Decadron 8 mg BID  Assessment and Plan: Kiara [name] is a 47 year old female with past medical history significant for anemia and menorrhagia requiring blood transfusions presenting with severe microcytic anemia and thrombocytopenia. Assessment & Plan Anemia Patient's Hgb critically low at 4.3>4.1>6.0>7.3 s/p 3 units of pRBC. Iron infusions 1000 mg per hematology recommended. Likely component of IDA secondary to heavy menstrual bleeding. No evidence of thalassemia. No active signs of bleeding currently and labs without evidence of hemolysis. Ganzoni equation for IDA shows deficit of 674 mg. TVUS demonstrated growth in uterine fibroids, possible hemorrhagic cyst, and 12 mm endometrial thickness.  - IV Ferrlecit 250 mg (9/11-9/13) - Megase 40 BID continuously until GYN follow up outpatient & consider TXA x5 days if bleeding continues - CBC w/ diff pending - Miralax and Senna Thrombocytopenia (HCC) Low platelet count of 30>25>25. PT/PTT 14.2/29, INR 1.1. Haptoglobin 106. Although rare, could be associated with IDA vs drug-induced. Blood smear demonstrated teardrop cells. HIV and UA negative.  - CBC w/ diff pending - If plts remain low, Decadron 8 mg BID x7 days and then taper it slowly with Protonix daily; if plts wnl no need for Decadron and Protonix - Transfuse platelets for <10k or <50k with signs of bleeding - Hematology consulted, appreciate recs  Chronic and Stable Problems: Dental Abscess: on Clindamycin, held on admission  FEN/GI: Regular diet PPx: SCDs Dispo: pending  clinical improvement  Subjective:  Patient is doing well this morning.  Still describes some mid thoracic pain, improving on heating pad.  Denies dizziness or weakness.  Denies any active bleeding.  Objective: Temp:  [97.9 F (36.6 C)-98.8 F (37.1 C)] 98.8 F (37.1 C) (09/13 0735) Pulse Rate:  [67-76] 72 (09/13 0735) Resp:  [16-18] 16 (09/13 0735) BP: (117-129)/(59-81) 117/59 (09/13 0735) SpO2:  [99 %-100 %] 100 % (09/13 0735) Physical Exam: General: NAD, awake and alert HEENT: Normocephalic, atraumatic. Conjunctiva normal. No nasal discharge Cardiovascular: RRR. No M/R/G Respiratory: CTAB, normal WOB on RA. No wheezing, crackles, rhonchi, or diminished breath sounds. Abdomen: Soft, non-tender, non-distended. Bowel sounds normoactive Extremities: Mild TTP over T7-T9 vertebrae. No BLE edema, no deformities or significant joint findings. Skin: Warm and dry. Neuro: A&Ox3. No focal neurological deficits.   Laboratory: Most recent CBC Lab Results  Component Value Date   WBC 6.0 05/23/2023   HGB 7.3 (L) 05/23/2023   HCT 25.0 (L) 05/23/2023   MCV 72.0 (L) 05/23/2023   PLT 25 (LL) 05/23/2023   Most recent BMP    Latest Ref Rng & Units 05/23/2023    8:50 AM  BMP  Glucose 70 - 99 mg/dL 301   BUN 6 - 20 mg/dL 12   Creatinine 6.01 - 1.00 mg/dL 0.93   Sodium 235 - 573 mmol/L 136   Potassium 3.5 - 5.1 mmol/L 3.9   Chloride 98 - 111 mmol/L 108   CO2 22 - 32 mmol/L 20   Calcium 8.9 - 10.3 mg/dL 8.5    Imaging/Diagnostic Tests: No new imaging.  Fortunato Curling, DO 05/24/2023, 11:25 AM  PGY-1, [place] Family  Medicine FPTS Intern pager: [phone], text pages welcome Secure chat group [facility] Teaching Service

## 2023-05-24 NOTE — Progress Notes (Signed)
Discharge instructions (including medications) discussed with and copy provided to patient. Patient verbalized understanding. PIV removed and patient called her ride home. She is showering and waiting for her ride home. TOC medications delivered to room.

## 2023-05-24 NOTE — Progress Notes (Signed)
Subjective: The patient is seen and examined today.  She is feeling fine and was eating her breakfast.  She denied having any current bleeding, bruises or ecchymosis.  Her dizzy spells have significantly improved.  She has no nausea, vomiting, diarrhea or constipation.  She has no chest pain, shortness of breath, cough or hemoptysis.  Objective: Vital signs in last 24 hours: Temp:  [97.9 F (36.6 C)-98.8 F (37.1 C)] 98.8 F (37.1 C) (09/13 0735) Pulse Rate:  [67-76] 72 (09/13 0735) Resp:  [16-18] 16 (09/13 0735) BP: (117-129)/(59-81) 117/59 (09/13 0735) SpO2:  [99 %-100 %] 100 % (09/13 0735)  Intake/Output from previous day: 09/12 0701 - 09/13 0700 In: 240 [P.O.:240] Out: -  Intake/Output this shift: No intake/output data recorded.  General appearance: alert, cooperative, and no distress Resp: clear to auscultation bilaterally Cardio: regular rate and rhythm, S1, S2 normal, no murmur, click, rub or gallop GI: soft, non-tender; bowel sounds normal; no masses,  no organomegaly Extremities: extremities normal, atraumatic, no cyanosis or edema  Lab Results:  Recent Labs    05/22/23 0548 05/23/23 0850  WBC 5.5 6.0  HGB 6.0* 7.3*  HCT 20.7* 25.0*  PLT 25* 25*   BMET Recent Labs    05/22/23 0548 05/23/23 0850  NA 139 136  K 3.5 3.9  CL 109 108  CO2 21* 20*  GLUCOSE 100* 105*  BUN 6 12  CREATININE 0.61 0.60  CALCIUM 8.6* 8.5*    Studies/Results: US PELVIC COMPLETE WITH TRANSVAGINAL  Result Date: 05/22/2023 CLINICAL DATA:  Heavy periods, iron deficiency anemia. EXAM: TRANSABDOMINAL AND TRANSVAGINAL ULTRASOUND OF PELVIS TECHNIQUE: Both transabdominal and transvaginal ultrasound examinations of the pelvis were performed. Transabdominal technique was performed for global imaging of the pelvis including uterus, ovaries, adnexal regions, and pelvic cul-de-sac. It was necessary to proceed with endovaginal exam following the transabdominal exam to visualize the ovaries and  endometrium. COMPARISON:  Pelvic ultrasound 01/23/2010.  CT pelvis 06/16/2018. FINDINGS: Uterus Measurements: 10.8 x 6.2 x 6.9 cm = volume: 24 mL. Anterior right intramural fibroid measures 3.4 x 4.4 x 3.2 cm. Posterior left intramural fibroid measures 3.8 x 3.0 x 3.8 cm. Endometrium Thickness: 12 mm.  No focal abnormality visualized. Right ovary Measurements: 3.5 x 3.4 x 2.7 cm = volume: 16 mL. There is a heterogeneous nonvascular hypoechoic area in the right ovary measuring 2.6 x 2.2 x 1.9 cm. Left ovary Measurements: 3.7 x 1.8 x 3.0 cm = volume: 10.6 mL. Normal appearance/no adnexal mass. Other findings No abnormal free fluid. IMPRESSION: 1. Uterine fibroids have increased in size and number. 2. Heterogeneous nonvascular hypoechoic area in the right ovary measuring 2.6 cm, possible hemorrhagic cyst. Consider follow-up pelvic ultrasound in 6-12 weeks to assess for resolution. 3. Endometrial thickness of 12 mm. If bleeding remains unresponsive to hormonal or medical therapy, sonohysterogram should be considered for focal lesion work-up. (: Radiological Reasoning: Algorithmic Workup of Abnormal with Endovaginal Sonography and Sonohysterography. AJR. Electronically Signed   By: Darliss Cheney M.D.   On: 05/22/2023 22:39    Medications: I have reviewed the patient's current medications.   Assessment/Plan: This is a very pleasant 47 years old African-American female who was admitted with severe anemia as well as thrombocytopenia.  She was on treatment with clindamycin before her admission. The patient was found to have severe iron deficiency anemia and she was treated with PRBCs transfusion as well as iron infusion with ferrous gluconate. Lab work yesterday showed some improvement of her anemia. She will need to  continue close monitoring and repeat iron infusion next week if no improvement in her hemoglobin and hematocrit. Regarding the thrombocytopenia this is likely drug-induced.  It may take longer to  recover. I will repeat her CBC today and if her platelets count still low with no improvement compared to yesterday, I will start her empirically on Decadron 8 mg p.o. twice daily for the next 7 days then I will taper it slowly after that. I do not see any contraindication for the patient to start Megace or TXA by gynecology for her bleeding issues. I will be happy to arrange a follow-up appointment for the patient with me next week to repeat her blood work or alternatively can follow-up with the family practice clinic with repeat CBC, iron study and ferritin and if she has no improvement in her condition I will be happy to see her at the cancer center for additional recommendation. Thank you for taking good care of Ms. Clearance Coots.  Please call if you have any additional questions.  LOS: 3 days    Lajuana Matte 05/24/2023

## 2023-05-24 NOTE — Discharge Summary (Addendum)
Family Medicine Teaching [facility] Discharge Summary  Patient name: Kiara [name] Medical record number: [id number] Date of birth: 1976-01-15 Age: 47 y.o. Gender: female Date of Admission: 05/21/2023  Date of Discharge: 05/24/23  Admitting Physician: Westley Chandler, MD  Primary Care Provider: Ivery Quale, MD Consultants: Hematology  Indication for Hospitalization: symptomatic anemia requiring blood transfusions  Brief Hospital Course:  Kiara [name] is a 47 y.o.female with a history of iron deficiency anemia and menorrhagia requiring prior transfusions who was admitted to the [facility] Medicine Teaching Service at [facility] for symptomatic anemia. Her hospital course is detailed below:  Anemia Patient experiencing worsening fatigue and lightheadedness over the past week, no signs of active bleeding at presentation. Initial workup indicated patient's Hgb critically low at 4.1. Iron panel indicated low ferritin of 2, iron 84, elevated TIBC of 472. B12 normal at 409. Patient received 3 units of pRBC, with Hgb at discharge being 8.0. No signs of bleeding throughout hospital stay. S/p 3 IV infusions, a total of 750 mg. Started Megace 40 mg BID per GYN until follow up with GYN for her menorrhagia and upcoming menstrual cycle. Advised patient to take 5 days of TXA if her menstrual cycle starts despite being on Megace.   Thrombocytopenia Initial workup indicated low platelet count of 30k, worsened to 25k. No active signs of bleeding at presentation. Per hematology, since thrombocytopenia persists, will start Decadron 8 mg BID x 7 days and Protonix. Will taper Decadron after 7 days and d/c Protonix.    PCP Follow Up Recommendations Dr. Threasa Beards wants to be PCP, please message the inpatient team if patient comes in for hospital follow up. Taper Decadron after 7 days (9/13-.9/19) Started patient on iron supplementation for her chronic anemia and menorrhagia. CBC, iron studies and ferritin  monthly for the next 3 months per hematology.  Follow up with Dr. Si Gaul at [facility] for further recommendations for anemia, thrombocytopenia, and to discuss ongoing need for more iron infusion outpatient with onc.  Repeat pelvic US in 6-12 weeks from 9/11 to assess for resolution of possible hemorrhagic cyst recommended by radiologist, but it could be due to where she is in her menstrual cycle. Reconsider if menorrhagia continues and if there is concern.  Referral to GYN outpatient to discuss options for her fibroids and OCP vs Megace continuation with menorrhagia. GYN may consider a endometrial biopsy as well outpatient.     Discharge Diagnoses/Problem List:  Principal Problem:   Anemia Active Problems:   Thrombocytopenia (HCC)  Disposition: home  Discharge Condition: stable  Discharge Exam:  General: NAD, awake and alert HEENT: Normocephalic, atraumatic. Conjunctiva normal. No nasal discharge Cardiovascular: RRR. No M/R/G Respiratory: CTAB, normal WOB on RA. No wheezing, crackles, rhonchi, or diminished breath sounds. Abdomen: Soft, non-tender, non-distended. Bowel sounds normoactive Extremities: Mild TTP over T7-T9 vertebrae. No BLE edema, no deformities or significant joint findings. Skin: Warm and dry. Neuro: A&Ox3. No focal neurological deficits.  Significant Procedures: none  Significant Labs and Imaging:  Recent Labs  Lab 05/23/23 0850 05/24/23 1043  WBC 6.0 8.3  HGB 7.3* 8.0*  HCT 25.0* 27.4*  PLT 25* 49*   Recent Labs  Lab 05/23/23 0850  NA 136  K 3.9  CL 108  CO2 20*  GLUCOSE 105*  BUN 12  CREATININE 0.60  CALCIUM 8.5*   US PELVIC COMPLETE WITH TRANSVAGINAL  Result Date: 05/22/2023 CLINICAL DATA:  Heavy periods, iron deficiency anemia. EXAM: TRANSABDOMINAL AND TRANSVAGINAL ULTRASOUND OF PELVIS TECHNIQUE:  Both transabdominal and transvaginal ultrasound examinations of the pelvis were performed. Transabdominal technique was performed for  global imaging of the pelvis including uterus, ovaries, adnexal regions, and pelvic cul-de-sac. It was necessary to proceed with endovaginal exam following the transabdominal exam to visualize the ovaries and endometrium. COMPARISON:  Pelvic ultrasound 01/23/2010.  CT pelvis 06/16/2018. FINDINGS: Uterus Measurements: 10.8 x 6.2 x 6.9 cm = volume: 24 mL. Anterior right intramural fibroid measures 3.4 x 4.4 x 3.2 cm. Posterior left intramural fibroid measures 3.8 x 3.0 x 3.8 cm. Endometrium Thickness: 12 mm.  No focal abnormality visualized. Right ovary Measurements: 3.5 x 3.4 x 2.7 cm = volume: 16 mL. There is a heterogeneous nonvascular hypoechoic area in the right ovary measuring 2.6 x 2.2 x 1.9 cm. Left ovary Measurements: 3.7 x 1.8 x 3.0 cm = volume: 10.6 mL. Normal appearance/no adnexal mass. Other findings No abnormal free fluid. IMPRESSION: 1. Uterine fibroids have increased in size and number. 2. Heterogeneous nonvascular hypoechoic area in the right ovary measuring 2.6 cm, possible hemorrhagic cyst. Consider follow-up pelvic ultrasound in 6-12 weeks to assess for resolution. 3. Endometrial thickness of 12 mm. If bleeding remains unresponsive to hormonal or medical therapy, sonohysterogram should be considered for focal lesion work-up. (: Radiological Reasoning: Algorithmic Workup of Abnormal with Endovaginal Sonography and Sonohysterography. AJR. Electronically Signed   By: Darliss Cheney M.D.   On: 05/22/2023 22:39     Results/Tests Pending at Time of Discharge: none  Discharge Medications:  Allergies as of 05/24/2023       Reactions   Shellfish Allergy Rash        Medication List     STOP taking these medications    chlorhexidine 0.12 % solution Commonly known as: PERIDEX   clindamycin 300 MG capsule Commonly known as: CLEOCIN   ibuprofen 800 MG tablet Commonly known as: ADVIL       TAKE these medications    dexamethasone 4 MG tablet Commonly known as: DECADRON Take 2  tablets (8 mg total) by mouth every 12 (twelve) hours for 7 days.   FeroSul 325 (65 Fe) MG tablet Generic drug: ferrous sulfate Take 1 tablet (325 mg total) by mouth every other day.   megestrol 40 MG tablet Commonly known as: MEGACE Take 1 tablet (40 mg total) by mouth 2 (two) times daily.   pantoprazole 40 MG tablet Commonly known as: PROTONIX Take 1 tablet (40 mg total) by mouth daily. Start taking on: May 25, 2023   tranexamic acid 650 MG Tabs tablet Commonly known as: LYSTEDA Take 2 tablets (1,300 mg total) by mouth 3 (three) times daily for 5 days. Please take this when your period starts, for 5 days.        Discharge Instructions: Please refer to Patient Instructions section of EMR for full details.  Patient was counseled important signs and symptoms that should prompt return to medical care, changes in medications, dietary instructions, activity restrictions, and follow up appointments.   Follow-Up Appointments: Future Appointments  Date Time Provider Department Center  05/30/2023  9:20 AM Lockie Mola, MD [facility]     Fortunato Curling DO 05/24/2023, 3:26 PM PGY-1, [facility] Family Medicine  Resident attestation: I agree with the documentation of Dr. Fatima Blank above. I have made adjustments to her note as appropriate.  Alfredo Martinez, MD

## 2023-05-30 ENCOUNTER — Ambulatory Visit (INDEPENDENT_AMBULATORY_CARE_PROVIDER_SITE_OTHER): Payer: Medicaid Other | Admitting: Family Medicine

## 2023-05-30 ENCOUNTER — Encounter: Payer: Self-pay | Admitting: Family Medicine

## 2023-05-30 ENCOUNTER — Other Ambulatory Visit: Payer: Self-pay

## 2023-05-30 VITALS — BP 122/78 | HR 78 | Ht 63.0 in | Wt 155.0 lb

## 2023-05-30 DIAGNOSIS — D500 Iron deficiency anemia secondary to blood loss (chronic): Secondary | ICD-10-CM | POA: Diagnosis present

## 2023-05-30 DIAGNOSIS — D696 Thrombocytopenia, unspecified: Secondary | ICD-10-CM

## 2023-05-30 DIAGNOSIS — R5383 Other fatigue: Secondary | ICD-10-CM

## 2023-05-30 NOTE — Patient Instructions (Signed)
It was wonderful to see you today.  Please bring ALL of your medications with you to every visit.   Today we talked about:  Iron tablets - change to every other day due to constipation.   Call Dr. Rebecca Eaton office to check on the appointment.   I have put in a referral to gynecology.   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 Bring paperwork to next appointment with Dr. Threasa Beards.   Thank you for choosing [facility].   Please call [phone] with any questions about today's appointment.  Please be sure to schedule follow up at the front desk before you leave today.   Lockie Mola, MD  Family Medicine

## 2023-05-30 NOTE — Progress Notes (Signed)
    SUBJECTIVE:   CHIEF COMPLAINT / HPI:   Hospital follow-up I anemia I thrombocytopenia Patient recently discharged on September 13 after being admitted for hemoglobin of 4.1.  Received multiple transfusions.  Patient says she is still fatigued but feeling slightly better.  Says that she still has to take half hour breaks for every 1 and half hours of doing anything.  Has been taking her ferrous sulfate daily but has gotten constipated.  Patient has still been taking Megace 40 mg twice daily.  Says she has still not gotten her menstrual cycle though it is around the time that she would normally get it. Patient has been taking steroids daily without any side effects other than increased appetite.  Has not gotten a call from the cancer center for appointment to follow-up thrombocytopenia.  PERTINENT  PMH / PSH: Menorrhagia, thrombocytopenia, iron deficiency anemia  OBJECTIVE:   BP 122/78   Pulse 78   Ht 5\' 3"  (1.6 m)   Wt 155 lb (70.3 kg)   LMP 04/27/2023 (Exact Date)   SpO2 100%   BMI 27.46 kg/m   General: well appearing, in no acute distress HEENT: Inner eyelids pale, mucous membranes normal skin tone CV: RRR, radial pulses equal and palpable, cap refill less than 2 seconds Resp: Normal work of breathing on room air, CTAB Abd: Soft, non tender, non distended  Neuro: Alert & Oriented x 4    ASSESSMENT/PLAN:   Assessment & Plan Iron deficiency anemia due to chronic blood loss Menorrhagia has been stable as patient still not gotten her menstrual cycle while on Megace.  - CBC today - Continue ferrous sulfate, decreased to every other day secondary to constipation - Continue Megace until gynecology appointment - Referral to gynecology placed Thrombocytopenia [facility]) Patient will taper steroids. - Follow-up CBC - Referral placed to heme-onc as patient has still not gotten call for appointment with Dr. Si Gaul Fatigue, unspecified type As fatigue is improving most likely  is secondary to iron deficiency anemia however want to rule out other causes. - CBC - TSH with reflex to T4      Lockie Mola, MD [facility]

## 2023-05-31 LAB — CBC WITH DIFFERENTIAL/PLATELET

## 2023-06-01 LAB — CBC WITH DIFFERENTIAL/PLATELET
Eos: 0 %
Hematocrit: 35.4 % (ref 34.0–46.6)
Hemoglobin: 9.8 g/dL — ABNORMAL LOW (ref 11.1–15.9)
Immature Grans (Abs): 0.2 10*3/uL — ABNORMAL HIGH (ref 0.0–0.1)
Immature Granulocytes: 1 %
Lymphocytes Absolute: 0 10*3/uL (ref 0.0–0.4)
Lymphocytes Absolute: 2.2 10*3/uL (ref 0.7–3.1)
MCH: 21.7 pg — ABNORMAL LOW (ref 26.6–33.0)
MCHC: 27.7 g/dL — ABNORMAL LOW (ref 31.5–35.7)
MCV: 79 fL (ref 79–97)
Monocytes Absolute: 0 10*3/uL (ref 0.0–0.2)
Monocytes: 0 %
Monocytes: 4 %
Neutrophils Absolute: 0.8 10*3/uL (ref 0.1–0.9)
Neutrophils Absolute: 18 10*3/uL — ABNORMAL HIGH (ref 1.4–7.0)
Neutrophils: 10 %
Platelets: 85 %
RBC: 4.51 x10E6/uL (ref 3.77–5.28)
RDW: 560 10*3/uL — ABNORMAL HIGH (ref 150–450)
WBC: 21.3 10*3/uL (ref 3.4–10.8)

## 2023-06-01 LAB — IRON,TIBC AND FERRITIN PANEL
Ferritin: 259 ng/mL — ABNORMAL HIGH (ref 15–150)
Iron Saturation: 14 % — ABNORMAL LOW (ref 15–55)
Iron: 53 ug/dL (ref 27–159)
Total Iron Binding Capacity: 366 ug/dL (ref 250–450)
UIBC: 313 ug/dL (ref 131–425)

## 2023-06-01 LAB — TSH RFX ON ABNORMAL TO FREE T4: TSH: 0.86 u[IU]/mL (ref 0.450–4.500)

## 2023-06-02 NOTE — Assessment & Plan Note (Signed)
Menorrhagia has been stable as patient still not gotten her menstrual cycle while on Megace.  - CBC today - Continue ferrous sulfate, decreased to every other day secondary to constipation - Continue Megace until gynecology appointment - Referral to gynecology placed

## 2023-06-02 NOTE — Assessment & Plan Note (Signed)
Patient will taper steroids. - Follow-up CBC - Referral placed to heme-onc as patient has still not gotten call for appointment with Dr. Si Gaul

## 2023-06-06 ENCOUNTER — Telehealth: Payer: Self-pay | Admitting: Family Medicine

## 2023-06-06 NOTE — Telephone Encounter (Signed)
Spoke with patient regarding lab results.  Patient said that she is starting to feel little bit better but does occasionally get hot flashes.  York Spaniel that she would bring some paperwork from her job to her appointment on Tuesday with Dr. Threasa Beards.  I explained to patient that her hemoglobin was improving however not normal yet but this was expected.  I told her that her platelets were much much improved actually above the realm of normal.  However this could also be because she is on the state she was on the steroids at that time.  I also explained that her white blood cell count was very high however while looking at the labs it does seem that this is due to being on steroids.  I explained that we would most likely want to recheck these labs to see how they are while she is not on the steroids any longer.  Patient is in agreement with this.  Would recommend that PCP obtain a CBC with differential at follow-up appointment on 10/1.

## 2023-06-11 ENCOUNTER — Ambulatory Visit: Payer: Self-pay | Admitting: Family Medicine

## 2023-06-17 ENCOUNTER — Ambulatory Visit (INDEPENDENT_AMBULATORY_CARE_PROVIDER_SITE_OTHER): Payer: Medicaid Other | Admitting: Family Medicine

## 2023-06-17 ENCOUNTER — Encounter: Payer: Self-pay | Admitting: Family Medicine

## 2023-06-17 VITALS — BP 124/86 | HR 87 | Ht 63.0 in | Wt 158.2 lb

## 2023-06-17 DIAGNOSIS — Z0000 Encounter for general adult medical examination without abnormal findings: Secondary | ICD-10-CM | POA: Diagnosis not present

## 2023-06-17 DIAGNOSIS — D500 Iron deficiency anemia secondary to blood loss (chronic): Secondary | ICD-10-CM

## 2023-06-17 NOTE — Patient Instructions (Addendum)
Thank you for visiting clinic today - it is always our pleasure to care for you.  Today we discussed your anemia. We're so glad that you've been continuing to feel better. We will check your labs again today, and I will update you through MyChart with any letter for work you may need. In the meantime, please continue to work as you able.   Your gynecology referral was placed with the [facility]. Please call [phone] if you do not hear from them to schedule an appointment. Please continue your medications as is in the meantime.    Please see the attached for local dentistry and eye care resources.   Please schedule an appointment in 1 month for your annual physical and follow up on anemia.   Reach out any time with any questions or concerns you may have - we are here for you!  Ivery Quale, MD [facility] [phone]

## 2023-06-17 NOTE — Progress Notes (Signed)
    SUBJECTIVE:   CHIEF COMPLAINT / HPI:   Iron Deficiency Anemia Patient here today to follow-up on known anemia.  Since her last clinic visit in September, patient has been feeling well and like she is returning to her baseline.  She notes feeling lightheaded at times, not frequently.  She has been able to stand and walk around without becoming lightheaded, and she reports having more normal energy.  No bleeding symptoms noted recently: No menstrual bleeding or spotting, no blood in the urine or stool, no hemoptysis, no new bruising.  She has been taking her iron supplement every other day and will use a stool softener as needed for constipation.  She has had normal bowel movements over the past few days.  PERTINENT  PMH / PSH: Iron deficiency anemia, Thrombocytopenia  OBJECTIVE:   BP 124/86   Pulse 87   Ht 5\' 3"  (1.6 m)   Wt 158 lb 4 oz (71.8 kg)   LMP 04/27/2023 (Exact Date)   SpO2 100%   BMI 28.03 kg/m   General: Well-appearing. Resting comfortably in room. CV: Normal S1/S2. No extra heart sounds. Warm and well-perfused. Pulm: Breathing comfortably on room air. CTAB. No increased WOB. Abd: Soft, non-tender, non-distended. Ext:  No LE edema or erythema noted.  Skin:  Warm, dry. Psych: Pleasant and appropriate.    ASSESSMENT/PLAN:   Iron deficiency anemia   Patient's anemia symptoms appear well-controlled at this time with improving energy and activity. Last CBC in September with improving Hgb of 9.8. No bleeding current bleeding signs.  - Repeat CBC today - Continue reduced work as tolerated. Will update work recommendations following CBC result.  - Continue ferrous sulfate every other day - Referral to [facility] previously placed. Provided their phone number in AVS to schedule.  - Continue Megace until [facility] gynecology appt  Health Maintenance - Hepatitis C Screening ordered today.  - Dentistry and Eye Care resources provided with AVS for patient reference  as needed.   Patient to return to clinic in 1 month for continued follow up and annual health maintenance, including pap smear and colonoscopy.   Ivery Quale, MD [facility]

## 2023-06-18 ENCOUNTER — Encounter: Payer: Self-pay | Admitting: Family Medicine

## 2023-06-18 ENCOUNTER — Telehealth: Payer: Self-pay | Admitting: Family Medicine

## 2023-06-18 LAB — CBC
Hematocrit: 40 % (ref 34.0–46.6)
Hemoglobin: 11.9 g/dL (ref 11.1–15.9)
MCH: 25.2 pg — ABNORMAL LOW (ref 26.6–33.0)
MCHC: 29.8 g/dL — ABNORMAL LOW (ref 31.5–35.7)
MCV: 85 fL (ref 79–97)
Platelets: 272 10*3/uL (ref 150–450)
RBC: 4.72 x10E6/uL (ref 3.77–5.28)
RDW: 29.8 % — ABNORMAL HIGH (ref 11.7–15.4)
WBC: 7.7 10*3/uL (ref 3.4–10.8)

## 2023-06-18 LAB — HCV INTERPRETATION

## 2023-06-18 LAB — HCV AB W REFLEX TO QUANT PCR: HCV Ab: NONREACTIVE

## 2023-06-18 NOTE — Telephone Encounter (Signed)
Called patient and discussed recent lab results, including improved hemoglobin. Sent letters for work to patient's verified address.

## 2023-06-26 ENCOUNTER — Other Ambulatory Visit: Payer: Self-pay | Admitting: Medical Oncology

## 2023-06-26 DIAGNOSIS — D649 Anemia, unspecified: Secondary | ICD-10-CM

## 2023-06-27 ENCOUNTER — Encounter: Payer: Self-pay | Admitting: Medical Oncology

## 2023-06-27 ENCOUNTER — Inpatient Hospital Stay (HOSPITAL_BASED_OUTPATIENT_CLINIC_OR_DEPARTMENT_OTHER): Payer: Medicaid Other | Admitting: Internal Medicine

## 2023-06-27 ENCOUNTER — Inpatient Hospital Stay: Payer: Medicaid Other | Attending: Internal Medicine

## 2023-06-27 VITALS — BP 120/92 | HR 92 | Temp 98.2°F | Resp 17 | Ht 63.0 in | Wt 156.6 lb

## 2023-06-27 DIAGNOSIS — D500 Iron deficiency anemia secondary to blood loss (chronic): Secondary | ICD-10-CM

## 2023-06-27 DIAGNOSIS — D696 Thrombocytopenia, unspecified: Secondary | ICD-10-CM | POA: Diagnosis not present

## 2023-06-27 DIAGNOSIS — D649 Anemia, unspecified: Secondary | ICD-10-CM

## 2023-06-27 DIAGNOSIS — N920 Excessive and frequent menstruation with regular cycle: Secondary | ICD-10-CM | POA: Diagnosis present

## 2023-06-27 LAB — CBC WITH DIFFERENTIAL (CANCER CENTER ONLY)
Abs Immature Granulocytes: 0.02 10*3/uL (ref 0.00–0.07)
Basophils Absolute: 0 10*3/uL (ref 0.0–0.1)
Basophils Relative: 1 %
Eosinophils Absolute: 0.1 10*3/uL (ref 0.0–0.5)
Eosinophils Relative: 2 %
HCT: 41.7 % (ref 36.0–46.0)
Hemoglobin: 12.7 g/dL (ref 12.0–15.0)
Immature Granulocytes: 0 %
Lymphocytes Relative: 23 %
Lymphs Abs: 1.6 10*3/uL (ref 0.7–4.0)
MCH: 25.9 pg — ABNORMAL LOW (ref 26.0–34.0)
MCHC: 30.5 g/dL (ref 30.0–36.0)
MCV: 85.1 fL (ref 80.0–100.0)
Monocytes Absolute: 0.2 10*3/uL (ref 0.1–1.0)
Monocytes Relative: 3 %
Neutro Abs: 4.9 10*3/uL (ref 1.7–7.7)
Neutrophils Relative %: 71 %
Platelet Count: 185 10*3/uL (ref 150–400)
RBC: 4.9 MIL/uL (ref 3.87–5.11)
RDW: 26.5 % — ABNORMAL HIGH (ref 11.5–15.5)
WBC Count: 6.9 10*3/uL (ref 4.0–10.5)
nRBC: 0 % (ref 0.0–0.2)

## 2023-06-27 LAB — IRON AND IRON BINDING CAPACITY (CC-WL,HP ONLY)
Iron: 66 ug/dL (ref 28–170)
Saturation Ratios: 19 % (ref 10.4–31.8)
TIBC: 349 ug/dL (ref 250–450)
UIBC: 283 ug/dL (ref 148–442)

## 2023-06-27 LAB — CMP (CANCER CENTER ONLY)
ALT: 12 U/L (ref 0–44)
AST: 13 U/L — ABNORMAL LOW (ref 15–41)
Albumin: 4.4 g/dL (ref 3.5–5.0)
Alkaline Phosphatase: 74 U/L (ref 38–126)
Anion gap: 7 (ref 5–15)
BUN: 17 mg/dL (ref 6–20)
CO2: 25 mmol/L (ref 22–32)
Calcium: 10.2 mg/dL (ref 8.9–10.3)
Chloride: 109 mmol/L (ref 98–111)
Creatinine: 0.92 mg/dL (ref 0.44–1.00)
GFR, Estimated: >60 mL/min (ref >60)
Glucose, Bld: 87 mg/dL (ref 70–99)
Potassium: 3.9 mmol/L (ref 3.5–5.1)
Sodium: 141 mmol/L (ref 135–145)
Total Bilirubin: 0.4 mg/dL (ref 0.3–1.2)
Total Protein: 8.5 g/dL — ABNORMAL HIGH (ref 6.5–8.1)

## 2023-06-27 LAB — FERRITIN: Ferritin: 33 ng/mL (ref 11–307)

## 2023-06-27 LAB — FOLATE: Folate: 8.9 ng/mL (ref >5.9)

## 2023-06-27 LAB — VITAMIN B12: Vitamin B-12: 345 pg/mL (ref 180–914)

## 2023-06-27 NOTE — Progress Notes (Signed)
[facility] [phone]   [phone]  OFFICE PROGRESS NOTE  Ivery Quale, MD [address] [place] [address]  DIAGNOSIS:  1) iron deficiency anemia secondary to menorrhagia, improved 2) thrombocytopenia, resolved  PRIOR THERAPY: PRBCs transfusion in addition to iron infusion with Venofer during her hospitalization in September 2024  CURRENT THERAPY: Ferrous sulfate 325 mg p.o. daily  INTERVAL HISTORY: Kiara [name] 47 y.o. female returns to the clinic today for hospital follow-up visit. Discussed the use of AI scribe software for clinical note transcription with the patient, who gave verbal consent to proceed.  History of Present Illness   Kiara [name], a 47 year old patient, was admitted to the hospital a few weeks ago due to severe anemia and thrombocytopenia. The patient's hemoglobin was critically low at 6.0, with a hematocrit of 20.7, and platelet count of 25,000. During the hospital stay, the patient received three units of blood transfusion and three iron infusions. Post-discharge, the patient continued on oral ferrous sulfate, which she took with water.  Upon returning home, the patient experienced some dizziness but reported that her energy levels have since improved. The patient's menstrual cycle, which had been a concern, has not occurred since August. The patient has been on treatment with Megace and TXA, but is currently only taking Mannose.  The patient reported no current complaints of chest pain, breathing issues, dizzy spells, or bleeding. She has been vigilant about looking for bruises but has not observed any.       MEDICAL HISTORY: Past Medical History:  Diagnosis Date   Anemia     ALLERGIES:  is allergic to shellfish allergy.  MEDICATIONS:  Current Outpatient Medications  Medication Sig Dispense Refill   ferrous sulfate 325 (65 FE) MG tablet Take 1 tablet (325 mg total) by mouth every other day. 100  tablet 0   megestrol (MEGACE) 40 MG tablet Take 1 tablet (40 mg total) by mouth 2 (two) times daily. 60 tablet 0   pantoprazole (PROTONIX) 40 MG tablet Take 1 tablet (40 mg total) by mouth daily. 30 tablet 0   No current facility-administered medications for this visit.    SURGICAL HISTORY: No past surgical history on file.  REVIEW OF SYSTEMS:  Constitutional: positive for fatigue Eyes: negative Ears, nose, mouth, throat, and face: negative Respiratory: negative Cardiovascular: negative Gastrointestinal: negative Genitourinary:negative Integument/breast: negative Hematologic/lymphatic: negative Musculoskeletal:negative Neurological: negative Behavioral/Psych: negative Endocrine: negative Allergic/Immunologic: negative   PHYSICAL EXAMINATION: General appearance: alert, cooperative, fatigued, and no distress Head: Normocephalic, without obvious abnormality, atraumatic Neck: no adenopathy, no JVD, supple, symmetrical, trachea midline, and thyroid not enlarged, symmetric, no tenderness/mass/nodules Lymph nodes: Cervical, supraclavicular, and axillary nodes normal. Resp: clear to auscultation bilaterally Back: symmetric, no curvature. ROM normal. No CVA tenderness. Cardio: regular rate and rhythm, S1, S2 normal, no murmur, click, rub or gallop GI: soft, non-tender; bowel sounds normal; no masses,  no organomegaly Extremities: extremities normal, atraumatic, no cyanosis or edema Neurologic: Alert and oriented X 3, normal strength and tone. Normal symmetric reflexes. Normal coordination and gait  ECOG PERFORMANCE STATUS: 1 - Symptomatic but completely ambulatory  Blood pressure (!) 120/92, pulse 92, temperature 98.2 F (36.8 C), temperature source Oral, resp. rate 17, height 5\' 3"  (1.6 m), weight 156 lb 9.6 oz (71 kg), SpO2 100%.  LABORATORY DATA: Lab Results  Component Value Date   WBC 6.9 06/27/2023   HGB 12.7 06/27/2023   HCT 41.7 06/27/2023   MCV 85.1 06/27/2023  PLT 185  06/27/2023      Chemistry      Component Value Date/Time   NA 136 05/23/2023 0850   K 3.9 05/23/2023 0850   CL 108 05/23/2023 0850   CO2 20 (L) 05/23/2023 0850   BUN 12 05/23/2023 0850   CREATININE 0.60 05/23/2023 0850      Component Value Date/Time   CALCIUM 8.5 (L) 05/23/2023 0850   ALKPHOS 74 05/21/2023 2021   AST 19 05/21/2023 2021   ALT 29 05/21/2023 2021   BILITOT 0.5 05/21/2023 2021       RADIOGRAPHIC STUDIES: No results found.  ASSESSMENT AND PLAN: This is a very pleasant 47 years old African-American female with a history of iron deficiency anemia secondary to menorrhagia status post PRBCs transfusion and iron infusion during her hospitalization.  She also had history of severe thrombocytopenia during her hospitalization that probably secondary to her severe hemorrhage at that time.  This issues had resolved.    Severe Anemia and Thrombocytopenia during recent hospitalization Resolved. Hemoglobin improved from 6.0 to 12.7 and platelets from 25,000 to 185,000 after receiving 3 units of blood transfusion and 3 iron infusions. Currently on oral ferrous sulfate. -Continue ferrous sulfate once daily with orange juice or vitamin C for improved absorption. -Repeat labs in 3 months to monitor hemoglobin and platelet levels.  Menorrhagia No menses since August. Currently on Megace. -Continue Megace as prescribed. -Encouraged to maintain a diet rich in iron.  Follow-up in 3 months.   She was advised to call immediately if she has any other concerning symptoms in the interval. The patient voices understanding of current disease status and treatment options and is in agreement with the current care plan.  All questions were answered. The patient knows to call the clinic with any problems, questions or concerns. We can certainly see the patient much sooner if necessary. The total time spent in the appointment was 30 minutes.  Disclaimer: This note was dictated with voice  recognition software. Similar sounding words can inadvertently be transcribed and may not be corrected upon review.

## 2023-07-01 ENCOUNTER — Telehealth: Payer: Self-pay

## 2023-07-01 NOTE — Telephone Encounter (Signed)
Patient calls nurse line requesting to speak with PCP.   She is requesting guidance on taking medications. She reports she was given ferrous sulfate, megestrol and tranexamic acid over 1 month ago. She reports she was told to wait until she started her cycle to start taking these. She reports she started her cycle this morning and "assumes" she should start taking, however has questions.   Advised with forward to PCP for advisement.

## 2023-07-02 ENCOUNTER — Telehealth: Payer: Self-pay | Admitting: Family Medicine

## 2023-07-02 LAB — PROTEIN ELECTROPHORESIS, SERUM, WITH REFLEX
A/G Ratio: 1 (ref 0.7–1.7)
Albumin ELP: 3.8 g/dL (ref 2.9–4.4)
Alpha-1-Globulin: 0.2 g/dL (ref 0.0–0.4)
Alpha-2-Globulin: 0.6 g/dL (ref 0.4–1.0)
Beta Globulin: 0.9 g/dL (ref 0.7–1.3)
Gamma Globulin: 2 g/dL — ABNORMAL HIGH (ref 0.4–1.8)
Globulin, Total: 3.8 g/dL (ref 2.2–3.9)
Total Protein ELP: 7.6 g/dL (ref 6.0–8.5)

## 2023-07-02 NOTE — Telephone Encounter (Signed)
Spoke with patient about recent patient call regarding medications. Per patient's most recent oncology visit, recommend continuing iron supplement and Megace as prescribed. Per recent hospital discharge summary, recommend 5 days of TXA for breakthrough bleeding. Patient currently scheduled to see OBGYN on 11/19 - I will check to see if patient can be seen sooner.

## 2023-07-03 ENCOUNTER — Telehealth: Payer: Self-pay | Admitting: Family Medicine

## 2023-07-03 ENCOUNTER — Telehealth: Payer: Self-pay

## 2023-07-03 NOTE — Telephone Encounter (Signed)
Patient calls nurse line reporting she feels she passed possible products of conception.  She reports her LMP was in August. She reports on 10/17 she started having light pink spotting. She reports her back was hurting at work and she was experiencing abdominal cramping.   She reports she passed what looked like a "clear long bubble" around 2pm yesterday, which has her concerned. She reports "I didn't know I was pregnant." She reports she has not had a positive pregnancy test, as she has not taken one.  She reports she will attempt to send a picture through mychart.   She denies any abnormal bleeding since. She reports she is having a normal menstrual flow as of last night. She denies any severe abdominal cramping.   Will forward to PCP for advisement.   Precautions discussed with patient.

## 2023-07-03 NOTE — Telephone Encounter (Signed)
Spoke with patient re: recent patient call to nurse line about continued bleeding symptoms and passage of what patient describes as "tissue." Scheduled earliest ATC visit for patient tomorrow 10/24 at 9:50AM for assessment. Discussed going to the hospital for any heavy bleeding in the meantime. Patient expressed understanding throughout the conversation.

## 2023-07-04 ENCOUNTER — Ambulatory Visit (INDEPENDENT_AMBULATORY_CARE_PROVIDER_SITE_OTHER): Payer: Medicaid Other | Admitting: Family Medicine

## 2023-07-04 ENCOUNTER — Other Ambulatory Visit (HOSPITAL_COMMUNITY)
Admission: RE | Admit: 2023-07-04 | Discharge: 2023-07-04 | Disposition: A | Discharge status: Home / Self Care | Payer: Medicaid Other | Source: Ambulatory Visit | Attending: Family Medicine | Admitting: Family Medicine

## 2023-07-04 VITALS — BP 118/87 | Ht 63.0 in | Wt 159.6 lb

## 2023-07-04 DIAGNOSIS — N898 Other specified noninflammatory disorders of vagina: Secondary | ICD-10-CM

## 2023-07-04 DIAGNOSIS — D500 Iron deficiency anemia secondary to blood loss (chronic): Secondary | ICD-10-CM | POA: Diagnosis not present

## 2023-07-04 LAB — POCT WET PREP (WET MOUNT)
Clue Cells Wet Prep Whiff POC: NEGATIVE
Trichomonas Wet Prep HPF POC: ABSENT
WBC, Wet Prep HPF POC: NONE SEEN

## 2023-07-04 LAB — POCT URINE PREGNANCY: Preg Test, Ur: NEGATIVE

## 2023-07-04 NOTE — Progress Notes (Cosign Needed Addendum)
    SUBJECTIVE:   CHIEF COMPLAINT / HPI:   Vaginal discharge -Phone call 07/03/23, last LMP was in August -On 10/17 had light pink spotting, had cramping -No itching -Continue to bleed steadily heavier -Some white discharge on pad on Tuesday -Patient concerned for miscarriage -No hot flashes, increase fatigue -Last sexually active several weeks ago -Previously regular menstrual cycles -Has not been using any kind of birth control -Did not do any pregnancy test at home -No vaginal dryness   PERTINENT  PMH / PSH: Thrombocytopenia, Anemia  OBJECTIVE:   BP 118/87   Ht 5\' 3"  (1.6 m)   Wt 159 lb 9.6 oz (72.4 kg)   BMI 28.27 kg/m   General: NAD Neuro: A&O Cardiovascular: RRR, no murmurs, no peripheral edema Respiratory: normal WOB on RA, CTAB, no wheezes, ronchi or rales Abdomen: mild right sided suprapubic tenderness, no rebound or guarding Extremities: Moving all 4 extremities equally GU: Chaperone Present: Stacey Reaves.  Normal external genitalia, no obvious erythema, swelling, or lesions. No obvious vaginal discharge, vaginal walls without erythema or lesions. Cervix round, red, no lesions.  Few small blood clots present in vaginal canal, small amount of blood leakage from cervical os.    ASSESSMENT/PLAN:   Assessment & Plan Vaginal discharge Suspect the patient's vaginal discharge is likely due to yeast/BV versus blood clot due to irregular cycle while taking Megace. Low suspicion for miscarriage but will rule out. No symptoms of menopause but may be initial presentation of symptoms. Will rule out STIs.  Plan below discussed with patient. -Continue Megace -Urine pregnancy test - negative -Wet prep, GC testing, HIV, RPR Iron deficiency anemia due to chronic blood loss Anemia symptoms and vitals stable. Plan below discussed with patient who is agreeable. -Continue oral iron every other day -Repeat CBC, iron panel -Gyn appointment scheduled for 07/30/23 -Will call to  recommend colonoscopy   Return in about 1 month (around 08/04/2023).  Celine Mans, MD [facility]

## 2023-07-04 NOTE — Assessment & Plan Note (Addendum)
Anemia symptoms and vitals stable. Plan below discussed with patient who is agreeable. -Continue oral iron every other day -Repeat CBC, iron panel -Gyn appointment scheduled for 07/30/23 -Will call to recommend colonoscopy

## 2023-07-04 NOTE — Patient Instructions (Addendum)
It was great to see you! Thank you for allowing me to participate in your care!  Our plans for today:  - I will let you know the results of your lab testing. - The medication Megace can make your menstrual cycles irregular, this is likely the cause of your vaginal bleeding. -Your pregnancy test was negative. -Please continue to take iron every other day as well as your Megace. -Please make sure to follow-up with your first Gyn appointment on November 19.   Please arrive 15 minutes PRIOR to your next scheduled appointment time! If you do not, this affects OTHER patients' care.  Take care and seek immediate care sooner if you develop any concerns.   Celine Mans, MD, PGY-2 [facility] 11:25 AM 07/04/2023  [facility]

## 2023-07-05 LAB — CBC
Hematocrit: 41.1 % (ref 34.0–46.6)
Hemoglobin: 12.9 g/dL (ref 11.1–15.9)
MCH: 27.1 pg (ref 26.6–33.0)
MCHC: 31.4 g/dL — ABNORMAL LOW (ref 31.5–35.7)
MCV: 86 fL (ref 79–97)
Platelets: 195 10*3/uL (ref 150–450)
RBC: 4.76 x10E6/uL (ref 3.77–5.28)
RDW: 23.5 % — ABNORMAL HIGH (ref 11.7–15.4)
WBC: 6.1 10*3/uL (ref 3.4–10.8)

## 2023-07-05 LAB — RPR W/REFLEX TO TREPSURE: RPR: REACTIVE — AB

## 2023-07-05 LAB — HIV ANTIBODY (ROUTINE TESTING W REFLEX): HIV Screen 4th Generation wRfx: NONREACTIVE

## 2023-07-05 LAB — IRON,TIBC AND FERRITIN PANEL
Ferritin: 44 ng/mL (ref 15–150)
Iron Saturation: 19 % (ref 15–55)
Iron: 55 ug/dL (ref 27–159)
Total Iron Binding Capacity: 297 ug/dL (ref 250–450)
UIBC: 242 ug/dL (ref 131–425)

## 2023-07-05 LAB — RPR, QUANT: RPR, Quant: 1:16 {titer} — ABNORMAL HIGH

## 2023-07-07 ENCOUNTER — Telehealth: Payer: Self-pay | Admitting: Family Medicine

## 2023-07-07 NOTE — Telephone Encounter (Signed)
Error. Please disregard

## 2023-07-08 ENCOUNTER — Telehealth: Payer: Self-pay | Admitting: Family Medicine

## 2023-07-08 DIAGNOSIS — A539 Syphilis, unspecified: Secondary | ICD-10-CM | POA: Insufficient documentation

## 2023-07-08 LAB — CERVICOVAGINAL ANCILLARY ONLY
Chlamydia: NEGATIVE
Comment: NEGATIVE
Comment: NORMAL
Neisseria Gonorrhea: NEGATIVE

## 2023-07-08 MED ORDER — PENICILLIN G BENZATHINE 1200000 UNIT/2ML IM SUSY: 2.4000 10*6.[IU] | PREFILLED_SYRINGE | INTRAMUSCULAR | Status: AC

## 2023-07-08 NOTE — Telephone Encounter (Signed)
Called patient to discuss lab results.  She verified her date of birth.  Discussed positive syphilis test and RPR titer.  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  Denies any genital lesions.  Will require 3 weeks of intramuscular penicillin.  Schedule patient for nurse visits in our clinic over the next 3 weeks.  IM penicillin ordered.  Patient has appointment with PCP next week as well.  Patient will need follow-up for repeat RPR titer in 6 months. Problem list updated.

## 2023-07-11 ENCOUNTER — Ambulatory Visit (INDEPENDENT_AMBULATORY_CARE_PROVIDER_SITE_OTHER): Payer: Medicaid Other

## 2023-07-11 DIAGNOSIS — A539 Syphilis, unspecified: Secondary | ICD-10-CM | POA: Diagnosis present

## 2023-07-11 MED ORDER — PENICILLIN G BENZATHINE 1200000 UNIT/2ML IM SUSY
1.2000 10*6.[IU] | PREFILLED_SYRINGE | Freq: Once | INTRAMUSCULAR | Status: AC
  Administered 2023-07-11: 1.2 10*6.[IU] via INTRAMUSCULAR

## 2023-07-11 NOTE — Progress Notes (Signed)
Patient preset to nurse clinic Syphilis treatment.   2.4 million units given as 1.2 million units in LUOQ and RUOQ.  Patient tolerated both injections well.  Patient to return to nurse clinic for #2 treatment on 11/6.  Discussed staying abstinent until all treatment has been completed and all partner have been treated as well.

## 2023-07-17 ENCOUNTER — Ambulatory Visit: Payer: Self-pay

## 2023-07-18 ENCOUNTER — Ambulatory Visit: Payer: Self-pay | Admitting: Family Medicine

## 2023-07-25 ENCOUNTER — Ambulatory Visit: Payer: Medicaid Other

## 2023-07-25 DIAGNOSIS — A539 Syphilis, unspecified: Secondary | ICD-10-CM | POA: Diagnosis present

## 2023-07-25 MED ORDER — PENICILLIN G BENZATHINE 1200000 UNIT/2ML IM SUSY
1.2000 10*6.[IU] | PREFILLED_SYRINGE | Freq: Once | INTRAMUSCULAR | Status: AC
  Administered 2023-07-25: 1.2 10*6.[IU] via INTRAMUSCULAR

## 2023-07-25 NOTE — Progress Notes (Signed)
Patient preset to nurse clinic Syphilis treatment.    2.4 million units given as 1.2 million units in LUOQ and RUOQ.   Patient tolerated both injections well. Patient scheduled for follow up tomorrow afternoon with Dr. Threasa Beards. Third nurse visit scheduled for next Thursday, 11/21 at 10:00.  Patient observed 15 minutes post injection, no signs of adverse reaction noted.   Veronda Prude, RN

## 2023-07-26 ENCOUNTER — Ambulatory Visit (INDEPENDENT_AMBULATORY_CARE_PROVIDER_SITE_OTHER): Payer: Medicaid Other | Admitting: Family Medicine

## 2023-07-26 ENCOUNTER — Encounter: Payer: Self-pay | Admitting: Family Medicine

## 2023-07-26 VITALS — BP 118/81 | HR 79 | Ht 63.0 in | Wt 163.2 lb

## 2023-07-26 DIAGNOSIS — D500 Iron deficiency anemia secondary to blood loss (chronic): Secondary | ICD-10-CM

## 2023-07-26 DIAGNOSIS — Z0000 Encounter for general adult medical examination without abnormal findings: Secondary | ICD-10-CM | POA: Diagnosis not present

## 2023-07-26 DIAGNOSIS — M5431 Sciatica, right side: Secondary | ICD-10-CM

## 2023-07-26 MED ORDER — DICLOFENAC SODIUM 1 % EX GEL: 2.0000 g | Freq: Every day | CUTANEOUS | Status: DC | PRN

## 2023-07-26 MED ORDER — MEGESTROL ACETATE 40 MG PO TABS: 40.0000 mg | ORAL_TABLET | Freq: Two times a day (BID) | ORAL | 0 refills | Status: DC

## 2023-07-26 NOTE — Progress Notes (Unsigned)
    SUBJECTIVE:   CHIEF COMPLAINT / HPI:   Back pain  Patient describes low back pain that has been on and off for a couple years. The pain is on the R side with intermittent shooting pains down the right leg. Pain is worsened by standing, somewhat relieved by Tylenol. NO recent injuries or falls. No urinary incontinence or paresthesias.   Anemia Patient has not been able to see the O[facility] yet following most recent hospitlization for anemia likely secondary to heavy menstrual bleeding. Her energy remains stable. No dizziness or lightheadedness. No new bruising, hematuria, hemoptysis, or bloody stools. No palpitations, chest pain, or difficulty breathing. She last had some menstrual bleeding towards the end of October, last day of bleeding on the 27th.   PERTINENT  PMH / PSH: Thrombocytopenia, anemia   OBJECTIVE:   BP 118/81   Pulse 79   Ht 5\' 3"  (1.6 m)   Wt 163 lb 3.2 oz (74 kg)   LMP 06/28/2023   SpO2 100%   BMI 28.91 kg/m   General: Well-appearing. Resting comfortably in room. CV: Normal S1/S2. No extra heart sounds. Warm and well-perfused. Pulm: Breathing comfortably on room air. CTAB. No increased WOB. Abd: Soft, non-tender, non-distended. MSK: Nontender to palpation of spinal processes, iliac region, or surrounding soft tissues. Positive R straight leg test. Patient able to stand and ambulate without issue.  Skin:  Warm, dry. Psych: Pleasant and appropriate.    ASSESSMENT/PLAN:   Back pain R sided low back pain most consistent with sciatica. No red flag symptoms at this time.  - ordered voltaren gel  - lidocaine patches as needed - placed physical therapy referral   Anemia  Hgb stable 12.9 with normal iron/ferritin on 07/04/23. Patient remains asymptomatic. Patient has never had colonoscopy.  - continue ferrous sulfate every other day  - continue megestrol 40 BID  - planning to see OBGYN on 07/30/23 - provided letter for employer - placed referral for  colonoscopy  Return to clinic in 1-2 months for back pain follow up.   Ivery Quale, MD [facility]

## 2023-07-26 NOTE — Patient Instructions (Addendum)
Thank you for visiting clinic today - it is always our pleasure to care for you.  Today we discussed your back pain and recent anemia.   Your back pain appears consistent with sciatica. Please try using voltaren gel to help with your pain. You may also try using some over the counter lidocaine patches as well. I have also placed a referral for you to physical therapy - someone should contact you about scheduling an appointment.   I have refilled your Megace. Please continue to take this as prescribed until you see the OBGYN. You are currently scheduled to see them on Tuesday November 19th at 10:30 AM. Please see below for their contact info.   I have also placed an order for you to have a colonoscopy. Someone should be contacting you for further scheduling.   Please schedule an appointment in 1-2 months to follow up on your back pain.   Reach out any time with any questions or concerns you may have - we are here for you!  Kiara Quale, MD [facility] [phone]  OBGYN [facility] Address: [address] [place], [place] [address] Phone: [phone]

## 2023-07-29 NOTE — Progress Notes (Unsigned)
    SUBJECTIVE:   CHIEF COMPLAINT / HPI:   Abnormal uterine bleeding Patient is presenting to the clinic for evaluation for abnormal uterine bleeding.  She reports that she was recently hospitalized for anemia that they believe is likely related to her heavy menstrual cycles.  She received 3 units of PRBCs while hospitalized and then received 3 IV iron infusions.  She was started on Megace 40 mg twice daily and continued on that after discharge.  Patient reports she has been off the Megace for about 10 days and her period started back today.  Her menses were well-controlled on the Megace.  PERTINENT  PMH / PSH:   OBJECTIVE:   BP 129/86   Pulse 96   Wt 161 lb (73 kg)   LMP 06/26/2023 (Approximate) Comment: 6-7 days  BMI 28.52 kg/m   General: Well-appearing 47 year old female in no acute distress Cardiac: Regular rate and rhythm Respiratory: Normal work of breathing, speaking in full sentences Abdomen: Soft, nontender MSK: No gross abnormalities   ASSESSMENT/PLAN:   Excessive bleeding in premenopausal period Patient presenting for evaluation for severe bleeding with anemia requiring transfusions in the perimenopausal period.  Patient reports that the bleeding has been well-controlled on the Megace.  Had a lengthy discussion with the patient regarding treatment options including but not limited to continuing Megace, other OCPs, IUD placement.  We also discussed surgical options but patient is considering IUD placement.  She does need an endometrial biopsy but declines endometrial biopsy today because she just started her period and is requesting no pelvic exam today but reports that she will come back for this procedure.  We discussed continuing the Megace until further evaluation and patient is agreeable to this.  Sent prescription for 40 mg Megace daily which can be titrated as needed.  She will follow-up for endometrial biopsy and Pap smear in the near future.  Discussed strict return  precautions and hospital precautions and patient is agreeable.  Derrel Nip, MD Attending Family Medicine Physician, [facility] for [facility], [facility]

## 2023-07-30 ENCOUNTER — Other Ambulatory Visit: Payer: Self-pay

## 2023-07-30 ENCOUNTER — Ambulatory Visit: Payer: Medicaid Other | Admitting: Family Medicine

## 2023-07-30 ENCOUNTER — Encounter: Payer: Self-pay | Admitting: Family Medicine

## 2023-07-30 VITALS — BP 129/86 | HR 96 | Wt 161.0 lb

## 2023-07-30 DIAGNOSIS — N924 Excessive bleeding in the premenopausal period: Secondary | ICD-10-CM | POA: Diagnosis not present

## 2023-07-30 DIAGNOSIS — Z1331 Encounter for screening for depression: Secondary | ICD-10-CM

## 2023-07-30 LAB — POCT PREGNANCY, URINE: Preg Test, Ur: NEGATIVE

## 2023-07-30 MED ORDER — MEGESTROL ACETATE 40 MG PO TABS: 40.0000 mg | ORAL_TABLET | Freq: Every day | ORAL | 0 refills | Status: DC

## 2023-07-31 DIAGNOSIS — N924 Excessive bleeding in the premenopausal period: Secondary | ICD-10-CM | POA: Insufficient documentation

## 2023-07-31 NOTE — Assessment & Plan Note (Signed)
Patient presenting for evaluation for severe bleeding with anemia requiring transfusions in the perimenopausal period.  Patient reports that the bleeding has been well-controlled on the Megace.  Had a lengthy discussion with the patient regarding treatment options including but not limited to continuing Megace, other OCPs, IUD placement.  We also discussed surgical options but patient is considering IUD placement.  She does need an endometrial biopsy but declines endometrial biopsy today because she just started her period and is requesting no pelvic exam today but reports that she will come back for this procedure.  We discussed continuing the Megace until further evaluation and patient is agreeable to this.  Sent prescription for 40 mg Megace daily which can be titrated as needed.  She will follow-up for endometrial biopsy and Pap smear in the near future.  Discussed strict return precautions and hospital precautions and patient is agreeable.

## 2023-08-01 ENCOUNTER — Ambulatory Visit: Payer: Medicaid Other

## 2023-08-12 ENCOUNTER — Ambulatory Visit: Payer: Medicaid Other | Admitting: Family Medicine

## 2023-08-27 NOTE — Therapy (Unsigned)
OUTPATIENT PHYSICAL THERAPY THORACOLUMBAR EVALUATION  Patient Name: Kiara [name] MRN: [id number] DOB:Oct 04, 1975, 47 y.o., female Today's Date: 08/28/2023   PT End of Session - 08/28/23 1449     Visit Number 1    Number of Visits --   1-2x/week   Date for PT Re-Evaluation 10/23/23    Authorization Type MCD healthy blue - FOTO    PT Start Time 1306    PT Stop Time 1342    PT Time Calculation (min) 36 min             Past Medical History:  Diagnosis Date   Anemia    History reviewed. No pertinent surgical history. Patient Active Problem List   Diagnosis Date Noted   Excessive bleeding in premenopausal period 07/31/2023   Syphilis 07/08/2023   Thrombocytopenia (HCC) 05/22/2023   Anemia 05/21/2023    PCP: Ivery Quale, MD  REFERRING PROVIDER: Latrelle Dodrill, MD  THERAPY DIAG:  Other low back pain  Muscle weakness  Other abnormalities of gait and mobility  REFERRING DIAG: Sciatica of right side [M54.31]   Rationale for Evaluation and Treatment:  Rehabilitation  SUBJECTIVE:  PERTINENT PAST HISTORY:  none        PRECAUTIONS: None  WEIGHT BEARING RESTRICTIONS No  FALLS:  Has patient fallen in last 6 months? No, Number of falls: 0  MOI/History of condition:  Onset date: Intermittent chronic for several years  SUBJECTIVE STATEMENT  Kiara [name] is a 47 y.o. female who presents to clinic with chief complaint of low back pain with radiating pain into the R LE which can reach the level of the knee.  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  Denies BB changes.  Denies saddle anesthesia or n/t in R LE.  She does endorse some weakness which may be related to pain in the R LE.  Denies acute injury or trauma.   Red flags:  denies   Pain:  Are you having pain? Yes Pain location: low back and R LE NPRS scale:  0/10 to 8/10 Aggravating factors: standing, working Relieving factors: sitting,  rest Pain description: sharp, dull, and aching Stage: Chronic 24 hour pattern: better when she first gets up, starts slowly and builds during day.   Occupation: works at [employer] - Pharmacologist: na  Hand Dominance: na  Patient Goals/Specific Activities: reduce pain and improve function, walk for exercise, working out at gym   OBJECTIVE:   DIAGNOSTIC FINDINGS:  None recent  GENERAL OBSERVATION/GAIT: Antalgic gait, particularly immediately after standing  SENSATION: Light touch: Appears intact  LUMBAR AROM  AROM AROM  (Eval)  Flexion Fingertips to mid shin, w/ concordant pain  Extension limited by 25%, w/ concordant pain  Right lateral flexion WNL  Left lateral flexion WNL  Right rotation WNL  Left rotation WNL    (Blank rows = not tested)   LE MMT:  MMT Right (Eval) Left (Eval)  Hip flexion (L2, L3) 3+ 5  Knee extension (L3) 5 5  Knee flexion 4+ 5  Hip abduction 3+* 5  Hip extension 3-* 4  Hip external rotation 3+* 5  Hip internal rotation 3+* 5  Hip adduction    Ankle dorsiflexion (L4) c c  Ankle plantarflexion (S1) c c  Ankle inversion    Ankle eversion    Great Toe ext (L5) c c  Grossly     (Blank rows = not  tested, score listed is out of 5 possible points.  N = WNL, D = diminished, C = clear for gross weakness with myotome testing, * = concordant pain with testing)  SPECIAL TESTS:  Straight leg raise: L (-), R (+) Slump: L (-), R (+)  MUSCLE LENGTH: Hamstrings: Right significant restriction; Left no restriction Kiara [name] test: Right significant restriction; Left subtle restriction Ely's test: Right significant restriction; Left subtle restriction  LE ROM:  ROM Right (Eval) Left (Eval)  Hip flexion Limited N  Hip extension    Hip abduction    Hip adduction    Hip internal rotation    Hip external rotation    Knee flexion    Knee extension    Ankle dorsiflexion    Ankle plantarflexion    Ankle inversion    Ankle eversion       (Blank rows = not tested, N = WNL, * = concordant pain with testing)  Functional Tests  Eval                                                                PALPATION:   TTP lower lumbar spine paraspinals and with PA   PATIENT SURVEYS:  FOTO 44 -> 58   TODAY'S TREATMENT  Therapeutic Exercise: Creating, reviewing, and completing below HEP  PATIENT EDUCATION:  POC, diagnosis, prognosis, HEP, and outcome measures.  Pt educated via explanation, demonstration, and handout (HEP).  Pt confirms understanding verbally.   HOME EXERCISE PROGRAM: Access Code: [email or web address] URL: [email or web address] Date: 08/28/2023 Prepared by: Alphonzo Severance  Exercises - Supine Single Knee to Chest Stretch  - 2 x daily - 7 x weekly - 1 sets - 3 reps - 45 hold - Supine Lower Trunk Rotation  - 1 x daily - 7 x weekly - 1 sets - 20 reps - 3 hold - Supine Posterior Pelvic Tilt  - 2 x daily - 7 x weekly - 2 sets - 10 reps - 5'' hold - Supine Hip Adduction Isometric with Ball  - 1 x daily - 7 x weekly - 2 sets - 10 reps - 10'' hold - Hooklying Isometric Clamshell  - 1 x daily - 7 x weekly - 3 sets - 10 reps  Treatment priorities   Eval        R hip flexion/HS/hip ext        Progressive hip/core        PPT - excessive lordosis                          ASSESSMENT:  CLINICAL IMPRESSION: Kiara [name] is a 47 y.o. female who presents to clinic with signs and sxs consistent with low back pain with radicular pain.  No clear neurological weakness or n/t on exam.  Pt with limited hip ROM which may be contributing to agging hyper-lordotic posture.  Kiara [name] will benefit from skilled PT to address relevant deficits and improve pain with daily activities including work and recreation.  OBJECTIVE IMPAIRMENTS: Pain, lumbar ROM, hip ROM, hip and core strength  ACTIVITY LIMITATIONS: bending, standing, lifting, working, exercise  PERSONAL FACTORS: See medical history and pertinent  history   REHAB POTENTIAL: Good  CLINICAL DECISION MAKING: Stable/uncomplicated  EVALUATION COMPLEXITY: Low   GOALS:  SHORT TERM GOALS: Target date: 09/25/2023   Kiara [name] will be >75% HEP compliant to improve carryover between sessions and facilitate independent management of condition  Evaluation: ongoing Goal status: INITIAL   LONG TERM GOALS: Target date: 10/23/2023   Kiara [name] will improve FOTO score to 58 as a proxy for functional improvement  Evaluation/Baseline: 44 Goal status: INITIAL    2.  Kiara [name] will self report >/= 50% decrease in pain from evaluation to improve function in daily tasks  Evaluation/Baseline: 8/10 max pain Goal status: INITIAL   3.  Kiara [name] will improve the following MMTs to >/= 4/5 to show improvement in strength:     Evaluation/Baseline:   LE MMT:  MMT Right (Eval) Left (Eval)  Hip flexion (L2, L3) 3+ 5  Knee extension (L3) 5 5  Knee flexion 4+ 5  Hip abduction 3+* 5  Hip extension 3-* 4  Hip external rotation 3+* 5  Hip internal rotation 3+* 5  Hip adduction    Ankle dorsiflexion (L4) c c  Ankle plantarflexion (S1) c c  Ankle inversion    Ankle eversion    Great Toe ext (L5) c c  Grossly     (Blank rows = not tested, score listed is out of 5 possible points.  N = WNL, D = diminished, C = clear for gross weakness with myotome testing, * = concordant pain with testing)  Goal status: INITIAL   4.  Kiara [name] will be able to stand at work, not limited by pain  Evaluation/Baseline: limited Goal status: INITIAL   5.  Kiara [name] will be able to return to light exercise and walking, not limited by pain  Evaluation/Baseline: limited Goal status: INITIAL   PLAN: PT FREQUENCY: 1-2x/week  PT DURATION: 8 weeks  PLANNED INTERVENTIONS:  97164- PT Re-evaluation, 97110-Therapeutic exercises, 97530- Therapeutic activity, [id number]- Neuromuscular re-education, 97535- Self Care, 06301- Manual therapy, [id number]- Gait training, [id number]-  Aquatic Therapy, [phone]- Electrical stimulation (manual), [id number]- Vasopneumatic device, [id number]- Traction (mechanical), [id number]- Ionotophoresis 4mg /ml Dexamethasone, Taping, Dry Needling, Joint manipulation, and Spinal manipulation.   Alphonzo Severance PT, DPT 08/28/2023, 3:20 PM  I just finished a MCD eval/recert.  Kiara [name]  MRN: [id number] Please request 1x/week for 8 weeks.  Check all conditions that are expected to impact treatment: Musculoskeletal disorders   Check all possible CPT codes: 02542- Therapeutic Exercise, [phone]- Neuro Re-education, [phone] - Gait Training, [phone] - Manual Therapy, 97530 - Therapeutic Activities, 97535 - Self Care, [phone] - Re-evaluation, [id number] - Mechanical traction, and [id number] - Aquatic therapy   Thank you!

## 2023-08-28 ENCOUNTER — Encounter: Payer: Self-pay | Admitting: Physical Therapy

## 2023-08-28 ENCOUNTER — Ambulatory Visit: Payer: Medicaid Other | Attending: Family Medicine | Admitting: Physical Therapy

## 2023-08-28 ENCOUNTER — Other Ambulatory Visit: Payer: Self-pay

## 2023-08-28 DIAGNOSIS — M5459 Other low back pain: Secondary | ICD-10-CM | POA: Diagnosis present

## 2023-08-28 DIAGNOSIS — M5431 Sciatica, right side: Secondary | ICD-10-CM | POA: Diagnosis not present

## 2023-08-28 DIAGNOSIS — R2689 Other abnormalities of gait and mobility: Secondary | ICD-10-CM | POA: Diagnosis present

## 2023-08-28 DIAGNOSIS — M6281 Muscle weakness (generalized): Secondary | ICD-10-CM | POA: Diagnosis present

## 2023-09-17 ENCOUNTER — Ambulatory Visit: Payer: Medicaid Other | Attending: Family Medicine | Admitting: Physical Therapy

## 2023-09-17 ENCOUNTER — Telehealth: Payer: Self-pay | Admitting: Physical Therapy

## 2023-09-17 NOTE — Telephone Encounter (Signed)
Called and informed patient of missed visit and provided reminder of next appt and attendance policy.  

## 2023-09-19 ENCOUNTER — Other Ambulatory Visit: Payer: Self-pay

## 2023-09-19 ENCOUNTER — Encounter: Payer: Self-pay | Admitting: Obstetrics and Gynecology

## 2023-09-19 ENCOUNTER — Other Ambulatory Visit (HOSPITAL_COMMUNITY)
Admission: RE | Admit: 2023-09-19 | Discharge: 2023-09-19 | Disposition: A | Discharge status: Home / Self Care | Payer: Medicaid Other | Source: Ambulatory Visit | Attending: Obstetrics and Gynecology | Admitting: Obstetrics and Gynecology

## 2023-09-19 ENCOUNTER — Ambulatory Visit: Payer: Medicaid Other | Admitting: Obstetrics and Gynecology

## 2023-09-19 ENCOUNTER — Encounter: Payer: Self-pay | Admitting: Family Medicine

## 2023-09-19 VITALS — BP 130/87 | HR 94 | Wt 163.6 lb

## 2023-09-19 DIAGNOSIS — Z124 Encounter for screening for malignant neoplasm of cervix: Secondary | ICD-10-CM

## 2023-09-19 DIAGNOSIS — N924 Excessive bleeding in the premenopausal period: Secondary | ICD-10-CM | POA: Diagnosis present

## 2023-09-19 DIAGNOSIS — Z3202 Encounter for pregnancy test, result negative: Secondary | ICD-10-CM | POA: Diagnosis not present

## 2023-09-19 LAB — POCT PREGNANCY, URINE: Preg Test, Ur: NEGATIVE

## 2023-09-19 NOTE — Progress Notes (Signed)
 GYNECOLOGY VISIT  Patient name: Kiara [name] MRN [id number]  Date of birth: 01-12-1976 Chief Complaint:   No chief complaint on file.   History:  Kiara [name] is a 48 y.o. [phone] being seen today for EMB. Unsure when she last had a pap smear. She does experience some cramping with her bleeding - accepts paracervical block for EMB today. Not sure if she would like to have IUD inserted     Referral palced for AUB following admission for anemia requiring blood trasnfusions and discharged on megace  40 mg BID. Consierin gIUD for bleeding management. Also due for pap smear.   Past Medical History:  Diagnosis Date   Anemia     No past surgical history on file.  The following portions of the patient's history were reviewed and updated as appropriate: allergies, current medications, past family history, past medical history, past social history, past surgical history and problem list.   Health Maintenance:   Last pap No results found for: DIAGPAP, HPVHIGH, ADEQPAP Last mammogram: none on file    Review of Systems:  Pertinent items are noted in HPI. Comprehensive review of systems was otherwise negative.   Objective:  Physical Exam BP 130/87   Pulse 94   Wt 163 lb 9.6 oz (74.2 kg)   LMP 08/29/2023 (Approximate)   BMI 28.98 kg/m    Physical Exam Vitals and nursing note reviewed. Exam conducted with a chaperone present.  Constitutional:      Appearance: Normal appearance.  HENT:     Head: Normocephalic and atraumatic.  Pulmonary:     Effort: Pulmonary effort is normal.     Breath sounds: Normal breath sounds.  Genitourinary:    General: Normal vulva.     Exam position: Lithotomy position.     Vagina: Normal.     Cervix: Normal.     Comments: White discharge noted Skin:    General: Skin is warm and dry.  Neurological:     General: No focal deficit present.     Mental Status: She is alert.  Psychiatric:        Mood and Affect: Mood normal.         Behavior: Behavior normal.        Thought Content: Thought content normal.        Judgment: Judgment normal.      Labs and Imaging Narrative & Impression  CLINICAL DATA:  Heavy periods, iron  deficiency anemia.   EXAM: TRANSABDOMINAL AND TRANSVAGINAL ULTRASOUND OF PELVIS   TECHNIQUE: Both transabdominal and transvaginal ultrasound examinations of the pelvis were performed. Transabdominal technique was performed for global imaging of the pelvis including uterus, ovaries, adnexal regions, and pelvic cul-de-sac. It was necessary to proceed with endovaginal exam following the transabdominal exam to visualize the ovaries and endometrium.   COMPARISON:  Pelvic ultrasound 01/23/2010.  CT pelvis 06/16/2018.   FINDINGS: Uterus   Measurements: 10.8 x 6.2 x 6.9 cm = volume: 24 mL. Anterior right intramural fibroid measures 3.4 x 4.4 x 3.2 cm. Posterior left intramural fibroid measures 3.8 x 3.0 x 3.8 cm.   Endometrium   Thickness: 12 mm.  No focal abnormality visualized.   Right ovary   Measurements: 3.5 x 3.4 x 2.7 cm = volume: 16 mL. There is a heterogeneous nonvascular hypoechoic area in the right ovary measuring 2.6 x 2.2 x 1.9 cm.   Left ovary   Measurements: 3.7 x 1.8 x 3.0 cm = volume: 10.6 mL. Normal appearance/no adnexal mass.  Other findings   No abnormal free fluid.   IMPRESSION: 1. Uterine fibroids have increased in size and number. 2. Heterogeneous nonvascular hypoechoic area in the right ovary measuring 2.6 cm, possible hemorrhagic cyst. Consider follow-up pelvic ultrasound in 6-12 weeks to assess for resolution. 3. Endometrial thickness of 12 mm. If bleeding remains unresponsive to hormonal or medical therapy, sonohysterogram should be considered for focal lesion work-up. (: Radiological Reasoning: Algorithmic Workup of Abnormal with Endovaginal Sonography and Sonohysterography. AJR.     Electronically Signed   By: Greig Pique M.D.   On:  05/22/2023 22:39    Endometrial Biopsy Procedure  Patient identified, informed consent performed,  indication reviewed, consent signed.  Reviewed risk of perforation, pain, bleeding, insufficient sample, etc were reviewd. Time out was performed.  Urine pregnancy test negative.  Speculum placed in the vagina.  Cervix visualized.  Cleaned with Betadine x 2.  Anterior cervix grasped anteriorly with a single tooth tenaculum.  Paracervical block was administered.  Endometrial pipelle was used to draw up 1cc of 1% lidocaine , introduced into the cervical os and instilled into the endometrial cavity.  The pipelle was passed twice without difficulty and sample obtained. Tenaculum was removed, good hemostasis noted.  Patient tolerated procedure well.  Patient was given post-procedure instructions.      Assessment & Plan:   1. Excessive bleeding in premenopausal period (Primary) Now s/p uncomplicated EMB, will follow up surgical pathology. Patient would like to think further about whether or not to have IUD inserted to manage bleeding.  - Cytology - PAP - Surgical pathology  2. Screening for cervical cancer Pap collected.  - Cytology - PAP   Routine preventative health maintenance measures emphasized.  Carter Quarry, MD [facility] for [facility], [facility]

## 2023-09-19 NOTE — Patient Instructions (Signed)
 Liletta or mirena - both are progesterone IUDs (intrauterine devices) that can be used to help mange heavy menstrual bleeding

## 2023-09-23 ENCOUNTER — Telehealth: Payer: Self-pay

## 2023-09-23 LAB — SURGICAL PATHOLOGY

## 2023-09-23 NOTE — Telephone Encounter (Signed)
 Called patient regarding her lab results. VM is not set up.

## 2023-09-23 NOTE — Telephone Encounter (Signed)
-----   Message from [place] sent at 09/23/2023  1:55 PM EST ----- Benign endometrial biopsy - let us know if she would like IUD inserted or continue with megace

## 2023-09-24 ENCOUNTER — Ambulatory Visit: Payer: Medicaid Other

## 2023-09-24 ENCOUNTER — Telehealth: Payer: Self-pay

## 2023-09-24 LAB — CYTOLOGY - PAP
Comment: NEGATIVE
Diagnosis: NEGATIVE
High risk HPV: NEGATIVE

## 2023-09-24 NOTE — Therapy (Deleted)
 OUTPATIENT PHYSICAL THERAPY THORACOLUMBAR EVALUATION  Patient Name: Kiara [name] MRN: [id number] DOB:[birth year], 48 y.o., female Today's Date: 09/24/2023     Past Medical History:  Diagnosis Date   Anemia    Overweight 12/13/2021   Vitamin D deficiency 12/13/2021   No past surgical history on file. Patient Active Problem List   Diagnosis Date Noted   Excessive bleeding in premenopausal period 07/31/2023   Syphilis 07/08/2023   Thrombocytopenia (HCC) 05/22/2023   Chronic anemia 12/15/2021    PCP: Diona Perkins, MD  REFERRING PROVIDER: Diona Perkins, MD  THERAPY DIAG:  No diagnosis found.  REFERRING DIAG: Sciatica of right side [M54.31]   Rationale for Evaluation and Treatment:  Rehabilitation  SUBJECTIVE:  PERTINENT PAST HISTORY:  none        PRECAUTIONS: None  WEIGHT BEARING RESTRICTIONS No  FALLS:  Has patient fallen in last 6 months? No, Number of falls: 0  MOI/History of condition:  Onset date: Intermittent chronic for several years  SUBJECTIVE STATEMENT  Kiara [name] is a 48 y.o. female who presents to clinic with chief complaint of low back pain with radiating pain into the R LE which can reach the level of the knee.  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  Denies BB changes.  Denies saddle anesthesia or n/t in R LE.  She does endorse some weakness which may be related to pain in the R LE.  Denies acute injury or trauma.   Red flags:  denies   Pain:  Are you having pain? Yes Pain location: low back and R LE NPRS scale:  0/10 to 8/10 Aggravating factors: standing, working Relieving factors: sitting, rest Pain description: sharp, dull, and aching Stage: Chronic 24 hour pattern: better when she first gets up, starts slowly and builds during day.   Occupation: works at [employer] - Pharmacologist: na  Hand Dominance: na  Patient Goals/Specific Activities: reduce pain and  improve function, walk for exercise, working out at gym   OBJECTIVE:   DIAGNOSTIC FINDINGS:  None recent  GENERAL OBSERVATION/GAIT: Antalgic gait, particularly immediately after standing  SENSATION: Light touch: Appears intact  LUMBAR AROM  AROM AROM  (Eval)  Flexion Fingertips to mid shin, w/ concordant pain  Extension limited by 25%, w/ concordant pain  Right lateral flexion WNL  Left lateral flexion WNL  Right rotation WNL  Left rotation WNL    (Blank rows = not tested)   LE MMT:  MMT Right (Eval) Left (Eval)  Hip flexion (L2, L3) 3+ 5  Knee extension (L3) 5 5  Knee flexion 4+ 5  Hip abduction 3+* 5  Hip extension 3-* 4  Hip external rotation 3+* 5  Hip internal rotation 3+* 5  Hip adduction    Ankle dorsiflexion (L4) c c  Ankle plantarflexion (S1) c c  Ankle inversion    Ankle eversion    Great Toe ext (L5) c c  Grossly     (Blank rows = not tested, score listed is out of 5 possible points.  N = WNL, D = diminished, C = clear for gross weakness with myotome testing, * = concordant pain with testing)  SPECIAL TESTS:  Straight leg raise: L (-), R (+) Slump: L (-), R (+)  MUSCLE LENGTH: Hamstrings: Right significant restriction; Left no restriction Debby test: Right significant restriction; Left subtle restriction Ely's test: Right significant restriction; Left subtle restriction  LE  ROM:  ROM Right (Eval) Left (Eval)  Hip flexion Limited N  Hip extension    Hip abduction    Hip adduction    Hip internal rotation    Hip external rotation    Knee flexion    Knee extension    Ankle dorsiflexion    Ankle plantarflexion    Ankle inversion    Ankle eversion      (Blank rows = not tested, N = WNL, * = concordant pain with testing)  Functional Tests  Eval                                                                PALPATION:   TTP lower lumbar spine paraspinals and with PA   PATIENT SURVEYS:  FOTO 44 ->  58   TODAY'S TREATMENT  Therapeutic Exercise: Creating, reviewing, and completing below HEP  PATIENT EDUCATION:  POC, diagnosis, prognosis, HEP, and outcome measures.  Pt educated via explanation, demonstration, and handout (HEP).  Pt confirms understanding verbally.   HOME EXERCISE PROGRAM: Access Code: [email or web address] URL: [email or web address] Date: 08/28/2023 Prepared by: Helene Gasmen  Exercises - Supine Single Knee to Chest Stretch  - 2 x daily - 7 x weekly - 1 sets - 3 reps - 45 hold - Supine Lower Trunk Rotation  - 1 x daily - 7 x weekly - 1 sets - 20 reps - 3 hold - Supine Posterior Pelvic Tilt  - 2 x daily - 7 x weekly - 2 sets - 10 reps - 5'' hold - Supine Hip Adduction Isometric with Ball  - 1 x daily - 7 x weekly - 2 sets - 10 reps - 10'' hold - Hooklying Isometric Clamshell  - 1 x daily - 7 x weekly - 3 sets - 10 reps  Treatment priorities   Eval        R hip flexion/HS/hip ext        Progressive hip/core        PPT - excessive lordosis                          ASSESSMENT:  CLINICAL IMPRESSION: ***   Kiara [name] is a 48 y.o. female who presents to clinic with signs and sxs consistent with low back pain with radicular pain.  No clear neurological weakness or n/t on exam.  Pt with limited hip ROM which may be contributing to agging hyper-lordotic posture.  Kiara [name] will benefit from skilled PT to address relevant deficits and improve pain with daily activities including work and recreation.  OBJECTIVE IMPAIRMENTS: Pain, lumbar ROM, hip ROM, hip and core strength  ACTIVITY LIMITATIONS: bending, standing, lifting, working, exercise  PERSONAL FACTORS: See medical history and pertinent history   REHAB POTENTIAL: Good  CLINICAL DECISION MAKING: Stable/uncomplicated  EVALUATION COMPLEXITY: Low   GOALS:   SHORT TERM GOALS: Target date: 09/25/2023   Kiara [name] will be >75% HEP compliant to improve carryover between sessions and facilitate  independent management of condition  Evaluation: ongoing Goal status: INITIAL   LONG TERM GOALS: Target date: 10/23/2023   Kiara [name] will improve FOTO score to 58 as a proxy for functional improvement  Evaluation/Baseline: 44 Goal status: INITIAL    2.  Kiara [name] will self report >/=  50% decrease in pain from evaluation to improve function in daily tasks  Evaluation/Baseline: 8/10 max pain Goal status: INITIAL   3.  Kiara [name] will improve the following MMTs to >/= 4/5 to show improvement in strength:     Evaluation/Baseline:   LE MMT:  MMT Right (Eval) Left (Eval)  Hip flexion (L2, L3) 3+ 5  Knee extension (L3) 5 5  Knee flexion 4+ 5  Hip abduction 3+* 5  Hip extension 3-* 4  Hip external rotation 3+* 5  Hip internal rotation 3+* 5  Hip adduction    Ankle dorsiflexion (L4) c c  Ankle plantarflexion (S1) c c  Ankle inversion    Ankle eversion    Great Toe ext (L5) c c  Grossly     (Blank rows = not tested, score listed is out of 5 possible points.  N = WNL, D = diminished, C = clear for gross weakness with myotome testing, * = concordant pain with testing)  Goal status: INITIAL   4.  Kiara [name] will be able to stand at work, not limited by pain  Evaluation/Baseline: limited Goal status: INITIAL   5.  Kiara [name] will be able to return to light exercise and walking, not limited by pain  Evaluation/Baseline: limited Goal status: INITIAL   PLAN: PT FREQUENCY: 1-2x/week  PT DURATION: 8 weeks  PLANNED INTERVENTIONS:  [id number]- PT Re-evaluation, 97110-Therapeutic exercises, 97530- Therapeutic activity, [id number]- Neuromuscular re-education, 97535- Self Care, [id number]- Manual therapy, [id number]- Gait training, [id number]- Aquatic Therapy, [phone]- Electrical stimulation (manual), [id number]- Vasopneumatic device, [id number]- Traction (mechanical), [id number]- Ionotophoresis 4mg /ml Dexamethasone , Taping, Dry Needling, Joint manipulation, and Spinal manipulation.   Marko Molt, PT, DPT  09/24/2023  1:51 PM

## 2023-09-24 NOTE — Telephone Encounter (Signed)
 Unable to reach/LVM - MJ

## 2023-09-24 NOTE — Telephone Encounter (Signed)
 Error//MJ

## 2023-09-26 ENCOUNTER — Inpatient Hospital Stay: Payer: Medicaid Other | Admitting: Internal Medicine

## 2023-09-26 ENCOUNTER — Inpatient Hospital Stay: Payer: Medicaid Other | Attending: Internal Medicine

## 2023-09-26 DIAGNOSIS — N920 Excessive and frequent menstruation with regular cycle: Secondary | ICD-10-CM | POA: Insufficient documentation

## 2023-09-26 DIAGNOSIS — D500 Iron deficiency anemia secondary to blood loss (chronic): Secondary | ICD-10-CM | POA: Insufficient documentation

## 2023-09-26 LAB — CBC WITH DIFFERENTIAL (CANCER CENTER ONLY)
Abs Immature Granulocytes: 0.01 10*3/uL (ref 0.00–0.07)
Basophils Absolute: 0.1 10*3/uL (ref 0.0–0.1)
Basophils Relative: 1 %
Eosinophils Absolute: 0.1 10*3/uL (ref 0.0–0.5)
Eosinophils Relative: 1 %
HCT: 36.8 % (ref 36.0–46.0)
Hemoglobin: 12.4 g/dL (ref 12.0–15.0)
Immature Granulocytes: 0 %
Lymphocytes Relative: 23 %
Lymphs Abs: 1.3 10*3/uL (ref 0.7–4.0)
MCH: 29.2 pg (ref 26.0–34.0)
MCHC: 33.7 g/dL (ref 30.0–36.0)
MCV: 86.6 fL (ref 80.0–100.0)
Monocytes Absolute: 0.4 10*3/uL (ref 0.1–1.0)
Monocytes Relative: 6 %
Neutro Abs: 3.9 10*3/uL (ref 1.7–7.7)
Neutrophils Relative %: 69 %
Platelet Count: 260 10*3/uL (ref 150–400)
RBC: 4.25 MIL/uL (ref 3.87–5.11)
RDW: 14.2 % (ref 11.5–15.5)
WBC Count: 5.7 10*3/uL (ref 4.0–10.5)
nRBC: 0 % (ref 0.0–0.2)

## 2023-09-26 LAB — IRON AND IRON BINDING CAPACITY (CC-WL,HP ONLY)
Iron: 25 ug/dL — ABNORMAL LOW (ref 28–170)
Saturation Ratios: 6 % — ABNORMAL LOW (ref 10.4–31.8)
TIBC: 406 ug/dL (ref 250–450)
UIBC: 381 ug/dL (ref 148–442)

## 2023-09-26 NOTE — Telephone Encounter (Signed)
-----   Message from Rex sent at 09/23/2023  1:55 PM EST ----- Benign endometrial biopsy - let us know if she would like IUD inserted or continue with megace     Called pt to review; VM left stating I am calling with results and follow up questions from Dr. Briscoe Deutscher. Offered for patient to call back or view MyChart message.

## 2023-09-27 LAB — FERRITIN: Ferritin: 5 ng/mL — ABNORMAL LOW (ref 11–307)

## 2023-09-27 NOTE — Progress Notes (Deleted)
[facility] OFFICE PROGRESS NOTE  Ivery Quale, MD [address] [place] [address]  DIAGNOSIS:  1) iron deficiency anemia secondary to menorrhagia, improved 2) thrombocytopenia, resolved  PRIOR THERAPY: PRBCs transfusion in addition to iron infusion with Ferrlecit during her hospitalization in September 2024   CURRENT THERAPY: Ferrous sulfate 325 mg p.o. daily   INTERVAL HISTORY: Kiara [name] 48 y.o. female returns clinic today for follow-up visit.  The patient establish care in the clinic with Dr. Arbutus Ped on 06/27/23.  The patient was hospitalized in September 2024 with severe anemia and thrombocytopenia.  The patient required 3 units of blood and IV iron with Ferrlecit.  She was discharged on iron supplements.  Patient is currently taking iron supplements and is ***compliant with this.  The patient has menorrhagia for which she follows with Dr. Briscoe Deutscher from GYN.  Her menstrual cycles have been ***.  Megace? The plan is ***for IUD insertion.  She did have an endometrial biopsy which was ***  Her PCP also referred her to GI for consideration of colonoscopy.  The patient is scheduled for this on ***. I do not see this scheduled. I do see the referral though.   Since last being seen, the patient's fatigue has been ***.  Denies any other abnormal bleeding or bruising.  She denies any shortness of breath, dizziness, or lightheadedness.  She denies any chest discomfort.  He is craving ice chips.  Iron in her diet?  She is here today for evaluation repeat blood work.  MEDICAL HISTORY: Past Medical History:  Diagnosis Date   Anemia    Overweight 12/13/2021   Vitamin D deficiency 12/13/2021    ALLERGIES:  is allergic to shellfish allergy.  MEDICATIONS:  Current Outpatient Medications  Medication Sig Dispense Refill   ferrous sulfate 325 (65 FE) MG tablet Take 1 tablet (325 mg total) by mouth every other day. 100 tablet 0   megestrol (MEGACE) 40 MG tablet Take 1  tablet (40 mg total) by mouth daily. (Patient not taking: Reported on 09/19/2023) 60 tablet 0   pantoprazole (PROTONIX) 40 MG tablet Take 1 tablet (40 mg total) by mouth daily. 30 tablet 0   Current Facility-Administered Medications  Medication Dose Route Frequency Provider Last Rate Last Admin   diclofenac Sodium (VOLTAREN) 1 % topical gel 2 g  2 g Topical Daily PRN         SURGICAL HISTORY: No past surgical history on file.  REVIEW OF SYSTEMS:   Review of Systems  Constitutional: Negative for appetite change, chills, fatigue, fever and unexpected weight change.  HENT:   Negative for mouth sores, nosebleeds, sore throat and trouble swallowing.   Eyes: Negative for eye problems and icterus.  Respiratory: Negative for cough, hemoptysis, shortness of breath and wheezing.   Cardiovascular: Negative for chest pain and leg swelling.  Gastrointestinal: Negative for abdominal pain, constipation, diarrhea, nausea and vomiting.  Genitourinary: Negative for bladder incontinence, difficulty urinating, dysuria, frequency and hematuria.   Musculoskeletal: Negative for back pain, gait problem, neck pain and neck stiffness.  Skin: Negative for itching and rash.  Neurological: Negative for dizziness, extremity weakness, gait problem, headaches, light-headedness and seizures.  Hematological: Negative for adenopathy. Does not bruise/bleed easily.  Psychiatric/Behavioral: Negative for confusion, depression and sleep disturbance. The patient is not nervous/anxious.     PHYSICAL EXAMINATION:  Last menstrual period 08/29/2023.  ECOG PERFORMANCE STATUS: {CHL ONC ECOG [id number]  Physical Exam  Constitutional: Oriented to person, place, and time and  well-developed, well-nourished, and in no distress. No distress.  HENT:  Head: Normocephalic and atraumatic.  Mouth/Throat: Oropharynx is clear and moist. No oropharyngeal exudate.  Eyes: Conjunctivae are normal. Right eye exhibits no discharge. Left eye  exhibits no discharge. No scleral icterus.  Neck: Normal range of motion. Neck supple.  Cardiovascular: Normal rate, regular rhythm, normal heart sounds and intact distal pulses.   Pulmonary/Chest: Effort normal and breath sounds normal. No respiratory distress. No wheezes. No rales.  Abdominal: Soft. Bowel sounds are normal. Exhibits no distension and no mass. There is no tenderness.  Musculoskeletal: Normal range of motion. Exhibits no edema.  Lymphadenopathy:    No cervical adenopathy.  Neurological: Alert and oriented to person, place, and time. Exhibits normal muscle tone. Gait normal. Coordination normal.  Skin: Skin is warm and dry. No rash noted. Not diaphoretic. No erythema. No pallor.  Psychiatric: Mood, memory and judgment normal.  Vitals reviewed.  LABORATORY DATA: Lab Results  Component Value Date   WBC 5.7 09/26/2023   HGB 12.4 09/26/2023   HCT 36.8 09/26/2023   MCV 86.6 09/26/2023   PLT 260 09/26/2023      Chemistry      Component Value Date/Time   NA 141 06/27/2023 1128   K 3.9 06/27/2023 1128   CL 109 06/27/2023 1128   CO2 25 06/27/2023 1128   BUN 17 06/27/2023 1128   CREATININE 0.92 06/27/2023 1128      Component Value Date/Time   CALCIUM 10.2 06/27/2023 1128   ALKPHOS 74 06/27/2023 1128   AST 13 (L) 06/27/2023 1128   ALT 12 06/27/2023 1128   BILITOT 0.4 06/27/2023 1128       RADIOGRAPHIC STUDIES:  No results found.   ASSESSMENT/PLAN:  This is a very pleasant 48 year old African-American female with a history of iron deficiency anemia secondary to menorrhagia.  She also had severe thrombocytopenia during her hospitalization secondary to severe hemorrhage at that time.  The patient received IV iron during her hospitalization with Ferrlecit (last dose in September 2024) and blood transfusions.   Patient is currently taking ferrous sulfate ***milligrams daily and is ***  She is also following closely with GYN regarding her menorrhagia.  She had a  benign endometrial biopsy.  They are planning on **IUD insertion.  Her PCP also referred her to GI for routine colonoscopy.  The patient had a repeat CBC performed today.  She also has iron studies and ferritin which are still pending at this time.  Her labs from today show ***  I will arrange for ***  We will see her back for follow-up visit in 4 months for evaluation repeat blood work.  Will continue taking her iron supplement.  The patient was advised to call immediately if she has any concerning symptoms in the interval. The patient voices understanding of current disease status and treatment options and is in agreement with the current care plan. All questions were answered. The patient knows to call the clinic with any problems, questions or concerns. We can certainly see the patient much sooner if necessary   No orders of the defined types were placed in this encounter.    I spent {CHL ONC TIME VISIT - DGLOV:[id number]} counseling the patient face to face. The total time spent in the appointment was {CHL ONC TIME VISIT - ACZYS:[id number]}.  Areil Ottey L Locklyn Henriquez, PA-C 09/27/23

## 2023-10-02 ENCOUNTER — Inpatient Hospital Stay: Payer: Medicaid Other | Admitting: Physician Assistant

## 2023-10-02 ENCOUNTER — Telehealth: Payer: Self-pay

## 2023-10-02 ENCOUNTER — Telehealth: Payer: Self-pay | Admitting: Internal Medicine

## 2023-10-02 NOTE — Telephone Encounter (Signed)
Spoke with patient about missed appt today.  Patient stated that she called and LVM with schedulers to reschedule.  Staff message sent to scheduler.

## 2023-10-03 ENCOUNTER — Encounter: Payer: Self-pay | Admitting: Medical Oncology

## 2023-10-03 ENCOUNTER — Inpatient Hospital Stay (HOSPITAL_BASED_OUTPATIENT_CLINIC_OR_DEPARTMENT_OTHER): Payer: Medicaid Other | Admitting: Internal Medicine

## 2023-10-03 VITALS — BP 124/84 | HR 89 | Temp 98.2°F | Resp 17 | Ht 63.0 in | Wt 162.6 lb

## 2023-10-03 DIAGNOSIS — D500 Iron deficiency anemia secondary to blood loss (chronic): Secondary | ICD-10-CM

## 2023-10-03 DIAGNOSIS — D649 Anemia, unspecified: Secondary | ICD-10-CM | POA: Diagnosis not present

## 2023-10-03 NOTE — Progress Notes (Signed)
[facility] Telephone:([phone]) [phone]   Fax:([phone]  OFFICE PROGRESS NOTE  Kiara Quale, Kiara [name] [address] [place] [address]  DIAGNOSIS:  1) iron deficiency anemia secondary to menorrhagia, improved 2) thrombocytopenia, resolved  PRIOR THERAPY: PRBCs transfusion in addition to iron infusion with Venofer during her hospitalization in September 2024  CURRENT THERAPY: Ferrous sulfate 325 mg p.o. daily  INTERVAL HISTORY: Kiara [name] 48 y.o. female returns to the clinic today for hospital follow-up visit. Discussed the use of AI scribe software for clinical note transcription with the patient, who gave verbal consent to proceed.  History of Present Illness   Kiara [name], a 48 year old patient with a history of iron deficiency anemia secondary to heavy menstrual bleeding, presents for follow-up. The patient's anemia has previously required both oral iron supplementation and iron infusions, with the most recent infusion and blood transfusion occurring in September 2024.  However, the patient reports inconsistent adherence to the prescribed oral iron regimen due to significant constipation, a common side effect of oral iron supplementation. Despite this, the patient generally feels well, with only occasional episodes of dizziness and visual disturbances described as "seeing stars."  The patient's menstrual periods, initially well-controlled following the last hospital admission, have recently become heavier. Despite these symptoms and poor adherence to oral iron therapy, the patient's hemoglobin remains within the normal range. However, ferritin levels have significantly decreased, indicating depleted iron stores.  The patient's occupation as a cook is noted, with a preference for preparing salads. No other significant symptoms or health concerns were reported during this consultation.       MEDICAL HISTORY: Past Medical History:  Diagnosis Date    Anemia    Overweight 12/13/2021   Vitamin D deficiency 12/13/2021    ALLERGIES:  is allergic to shellfish allergy.  MEDICATIONS:  Current Outpatient Medications  Medication Sig Dispense Refill   ferrous sulfate 325 (65 FE) MG tablet Take 1 tablet (325 mg total) by mouth every other day. 100 tablet 0   megestrol (MEGACE) 40 MG tablet Take 1 tablet (40 mg total) by mouth daily. (Patient not taking: Reported on 09/19/2023) 60 tablet 0   pantoprazole (PROTONIX) 40 MG tablet Take 1 tablet (40 mg total) by mouth daily. 30 tablet 0   Current Facility-Administered Medications  Medication Dose Route Frequency Provider Last Rate Last Admin   diclofenac Sodium (VOLTAREN) 1 % topical gel 2 g  2 g Topical Daily PRN         SURGICAL HISTORY: No past surgical history on file.  REVIEW OF SYSTEMS:  A comprehensive review of systems was negative except for: Neurological: positive for dizziness   PHYSICAL EXAMINATION: General appearance: alert, cooperative, fatigued, and no distress Head: Normocephalic, without obvious abnormality, atraumatic Neck: no adenopathy, no JVD, supple, symmetrical, trachea midline, and thyroid not enlarged, symmetric, no tenderness/mass/nodules Lymph nodes: Cervical, supraclavicular, and axillary nodes normal. Resp: clear to auscultation bilaterally Back: symmetric, no curvature. ROM normal. No CVA tenderness. Cardio: regular rate and rhythm, S1, S2 normal, no murmur, click, rub or gallop GI: soft, non-tender; bowel sounds normal; no masses,  no organomegaly Extremities: extremities normal, atraumatic, no cyanosis or edema  ECOG PERFORMANCE STATUS: 1 - Symptomatic but completely ambulatory  Blood pressure 124/84, pulse 89, temperature 98.2 F (36.8 C), temperature source Temporal, resp. rate 17, height 5\' 3"  (1.6 m), weight 162 lb 9.6 oz (73.8 kg), last menstrual period 08/29/2023, SpO2 100%.  LABORATORY DATA: Lab Results  Component  Value Date   WBC 5.7 09/26/2023    HGB 12.4 09/26/2023   HCT 36.8 09/26/2023   MCV 86.6 09/26/2023   PLT 260 09/26/2023      Chemistry      Component Value Date/Time   NA 141 06/27/2023 1128   K 3.9 06/27/2023 1128   CL 109 06/27/2023 1128   CO2 25 06/27/2023 1128   BUN 17 06/27/2023 1128   CREATININE 0.92 06/27/2023 1128      Component Value Date/Time   CALCIUM 10.2 06/27/2023 1128   ALKPHOS 74 06/27/2023 1128   AST 13 (L) 06/27/2023 1128   ALT 12 06/27/2023 1128   BILITOT 0.4 06/27/2023 1128       RADIOGRAPHIC STUDIES: No results found.  ASSESSMENT AND PLAN: This is a very pleasant 48 years old African-American female with a history of iron deficiency anemia secondary to menorrhagia status post PRBCs transfusion and iron infusion during her hospitalization.      Iron Deficiency Anemia Iron deficiency anemia secondary to menorrhagia. Intolerance to oral iron due to severe constipation, leading to non-compliance. Symptoms include occasional dizziness and visual disturbances. Hemoglobin is 12.4 g/dL, ferritin is 5 ng/mL, serum iron is 25 g/dL, and iron saturation is 6%. Intravenous iron is necessary to replenish iron stores and prevent future anemia. - Administer Venofer, 300 mg intravenously weekly for three weeks at [facility]. - Schedule follow-up in three months to reassess iron levels and overall condition. - Advise to contact clinic if experiencing significant fatigue, weakness, or other concerning symptoms before follow-up.   The patient was advised to call immediately if she has any other concerning symptoms in the interval.  The patient voices understanding of current disease status and treatment options and is in agreement with the current care plan.  All questions were answered. The patient knows to call the clinic with any problems, questions or concerns. We can certainly see the patient much sooner if necessary. The total time spent in the appointment was 30  minutes.  Disclaimer: This note was dictated with voice recognition software. Similar sounding words can inadvertently be transcribed and may not be corrected upon review.

## 2023-10-11 ENCOUNTER — Ambulatory Visit: Payer: Medicaid Other | Admitting: Family Medicine

## 2023-10-13 ENCOUNTER — Encounter: Payer: Self-pay | Admitting: Family Medicine

## 2023-10-25 ENCOUNTER — Other Ambulatory Visit: Payer: Self-pay | Admitting: Physician Assistant

## 2023-10-28 ENCOUNTER — Telehealth: Payer: Self-pay

## 2023-10-28 NOTE — Telephone Encounter (Signed)
 Kiara [name], patient will be scheduled as soon as possible.  Auth Submission: NO AUTH NEEDED Site of care: Site of care: [facility] Payer: [employer] medicaid Medication & CPT/J Code(s) submitted: Venofer (Iron Sucrose) J1756 Route of submission (phone, fax, portal):  Phone # Fax # Auth type: Buy/Bill PB Units/visits requested: 300mg  x 3 doses Reference number:  Approval from: 10/28/23 to 04/26/24

## 2023-10-31 ENCOUNTER — Ambulatory Visit: Payer: Medicaid Other | Admitting: Family Medicine

## 2023-10-31 ENCOUNTER — Telehealth: Payer: Self-pay

## 2023-10-31 NOTE — Telephone Encounter (Signed)
 Attempted to call patient to reschedule appt due to weather. No answer. Unable to LVM due to mailbox being full . Aquilla Solian, CMA

## 2023-11-04 ENCOUNTER — Ambulatory Visit
Admission: RE | Admit: 2023-11-04 | Discharge: 2023-11-04 | Disposition: A | Discharge status: Home / Self Care | Payer: Medicaid Other | Source: Ambulatory Visit | Attending: Family Medicine | Admitting: Family Medicine

## 2023-11-04 ENCOUNTER — Ambulatory Visit (INDEPENDENT_AMBULATORY_CARE_PROVIDER_SITE_OTHER): Payer: Medicaid Other

## 2023-11-04 ENCOUNTER — Encounter: Payer: Self-pay | Admitting: Family Medicine

## 2023-11-04 VITALS — BP 118/83 | HR 108 | Temp 98.6°F | Ht 63.0 in | Wt 160.0 lb

## 2023-11-04 DIAGNOSIS — R051 Acute cough: Secondary | ICD-10-CM | POA: Diagnosis not present

## 2023-11-04 DIAGNOSIS — A539 Syphilis, unspecified: Secondary | ICD-10-CM | POA: Diagnosis not present

## 2023-11-04 DIAGNOSIS — J069 Acute upper respiratory infection, unspecified: Secondary | ICD-10-CM

## 2023-11-04 LAB — POC SOFIA 2 FLU + SARS ANTIGEN FIA
Influenza A, POC: NEGATIVE
Influenza B, POC: NEGATIVE
SARS Coronavirus 2 Ag: NEGATIVE

## 2023-11-04 NOTE — Patient Instructions (Signed)
 It was great to see you! Thank you for allowing me to participate in your care!  I recommend that you always bring your medications to each appointment as this makes it easy to ensure you are on the correct medications and helps [place] not miss when refills are needed.  Our plans for today:  - Go to [facility] at [address] after this apt for chest x ray - Hot tea and honey for cough - tylenol as needed - Return or go to ED if you have shortness of breath or cannot keep fluids   We are checking some labs today, I will call you if they are abnormal will send you a MyChart message or a letter if they are normal.  If you do not hear about your labs in the next 2 weeks please let us know.  Take care and seek immediate care sooner if you develop any concerns.   Dr. Erick Alley, DO [facility]

## 2023-11-04 NOTE — Progress Notes (Unsigned)
    SUBJECTIVE:   CHIEF COMPLAINT / HPI:   Sick symptoms started 6 days ago with fatigue, productive cough, intermittent headache, body aches (worse around front ribs - not associated with breathing or coughing). Subjective fever 5 days ago - not since. Has been taking tylenol  No SOB, diarrhea or vomiting. Has been nauseous with decreased appetite.  Son is sick with similar symptoms.   Latent syphilis  Recived pen on 10/*** and 11/14, never received third shot   PERTINENT  PMH / PSH: Thrombocytopenia    OBJECTIVE:   BP 118/83   Pulse (!) 108   Temp 98.6 F (37 C) (Oral)   Ht 5\' 3"  (1.6 m)   Wt 160 lb (72.6 kg)   SpO2 99%   BMI 28.34 kg/m    General: NAD, pleasant, able to participate in exam Cardiac: RRR, no murmurs. Respiratory: CTAB, normal effort, No wheezes, rales or rhonchi Abdomen: Bowel sounds present, nontender, nondistended, no hepatosplenomegaly. Extremities: no edema or cyanosis. Skin: warm and dry, no rashes noted Neuro: alert, no obvious focal deficits Psych: Normal affect and mood  ASSESSMENT/PLAN:   No problem-specific Assessment & Plan notes found for this encounter.    [facility] on [address]   Dr. Erick Alley, DO [facility]    {    This will disappear when note is signed, click to select method of visit    :1}

## 2023-11-05 ENCOUNTER — Ambulatory Visit (INDEPENDENT_AMBULATORY_CARE_PROVIDER_SITE_OTHER): Payer: Medicaid Other

## 2023-11-05 ENCOUNTER — Encounter: Payer: Self-pay | Admitting: Student

## 2023-11-05 VITALS — BP 111/76 | HR 89 | Temp 98.1°F | Resp 14 | Ht 63.0 in | Wt 158.6 lb

## 2023-11-05 DIAGNOSIS — D649 Anemia, unspecified: Secondary | ICD-10-CM | POA: Diagnosis not present

## 2023-11-05 DIAGNOSIS — D500 Iron deficiency anemia secondary to blood loss (chronic): Secondary | ICD-10-CM

## 2023-11-05 DIAGNOSIS — J069 Acute upper respiratory infection, unspecified: Secondary | ICD-10-CM | POA: Insufficient documentation

## 2023-11-05 MED ORDER — SODIUM CHLORIDE 0.9 % IV SOLN
300.0000 mg | Freq: Once | INTRAVENOUS | Status: AC
  Administered 2023-11-05: 300 mg via INTRAVENOUS
  Filled 2023-11-05: qty 15

## 2023-11-05 MED ORDER — DIPHENHYDRAMINE HCL 25 MG PO CAPS
25.0000 mg | ORAL_CAPSULE | Freq: Once | ORAL | Status: AC
  Administered 2023-11-05: 25 mg via ORAL
  Filled 2023-11-05: qty 1

## 2023-11-05 MED ORDER — ACETAMINOPHEN 325 MG PO TABS
650.0000 mg | ORAL_TABLET | Freq: Once | ORAL | Status: AC
  Administered 2023-11-05: 650 mg via ORAL
  Filled 2023-11-05: qty 2

## 2023-11-05 NOTE — Assessment & Plan Note (Signed)
 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 -RPR today which will reflex to trep-sure

## 2023-11-05 NOTE — Progress Notes (Signed)
 Diagnosis: Iron Deficiency Anemia  Provider:  Chilton Greathouse MD  Procedure: IV Infusion  IV Type: Peripheral, IV Location: R Antecubital  Venofer (Iron Sucrose), Dose: 300 mg  Infusion Start Time: 1211  Infusion Stop Time: 1355  Post Infusion IV Care: Observation period completed and Peripheral IV Discontinued  Discharge: Condition: Good, Destination: Home . AVS Declined  Performed by:  Rico Ala, LPN

## 2023-11-05 NOTE — Assessment & Plan Note (Signed)
 Negative for flu and COVID today.  Symptoms most consistent with viral URI especially as son has similar symptoms.  As she is on day 6 and I heard very mild intermittent rhonchi of the left lower lobe, STAT CXR was obtained which was clear with no consolidation concerning for bacterial PNA which was communicated to patient.  She is overall well-appearing with stable vitals other than mild tachycardia likely related to being sick. -Supportive care, return and ED precautions discussed -If symptoms persist/worsen over the week, would provide abx for CAP

## 2023-11-06 ENCOUNTER — Encounter: Payer: Self-pay | Admitting: Student

## 2023-11-06 LAB — RPR, QUANT+TP ABS (REFLEX)
Rapid Plasma Reagin, Quant: 1:8 {titer} — ABNORMAL HIGH
T Pallidum Abs: REACTIVE — AB

## 2023-11-06 LAB — RPR: RPR Ser Ql: REACTIVE — AB

## 2023-11-07 ENCOUNTER — Telehealth: Payer: Self-pay | Admitting: Student

## 2023-11-07 NOTE — Telephone Encounter (Signed)
 Called to check on patient who reports her cough has improved and she is feeling better since her last visit which is reassuring against pneumonia.  Discussed continued supportive care and return precautions and that postviral cough can linger for several weeks.

## 2023-11-12 ENCOUNTER — Ambulatory Visit: Payer: Medicaid Other

## 2023-11-12 VITALS — BP 128/82 | HR 92 | Temp 98.4°F | Resp 16 | Ht 63.0 in | Wt 161.0 lb

## 2023-11-12 DIAGNOSIS — N920 Excessive and frequent menstruation with regular cycle: Secondary | ICD-10-CM | POA: Diagnosis not present

## 2023-11-12 DIAGNOSIS — D649 Anemia, unspecified: Secondary | ICD-10-CM | POA: Diagnosis not present

## 2023-11-12 DIAGNOSIS — D500 Iron deficiency anemia secondary to blood loss (chronic): Secondary | ICD-10-CM

## 2023-11-12 MED ORDER — DIPHENHYDRAMINE HCL 25 MG PO CAPS
25.0000 mg | ORAL_CAPSULE | Freq: Once | ORAL | Status: AC
  Administered 2023-11-12: 25 mg via ORAL
  Filled 2023-11-12: qty 1

## 2023-11-12 MED ORDER — SODIUM CHLORIDE 0.9 % IV SOLN
300.0000 mg | Freq: Once | INTRAVENOUS | Status: AC
  Administered 2023-11-12: 300 mg via INTRAVENOUS
  Filled 2023-11-12: qty 15

## 2023-11-12 MED ORDER — ACETAMINOPHEN 325 MG PO TABS
650.0000 mg | ORAL_TABLET | Freq: Once | ORAL | Status: AC
  Administered 2023-11-12: 650 mg via ORAL
  Filled 2023-11-12: qty 2

## 2023-11-12 MED ORDER — IRON SUCROSE 300 MG IVPB - SIMPLE MED: 300.0000 mg | Freq: Once | Status: DC

## 2023-11-12 NOTE — Progress Notes (Signed)
 Diagnosis: Iron Deficiency Anemia  Provider:  Chilton Greathouse MD  Procedure: IV Infusion  IV Type: Peripheral, IV Location: R Antecubital  Venofer (Iron Sucrose), Dose: 300 mg  Infusion Start Time: 1052  Infusion Stop Time: 1235  Post Infusion IV Care: Observation period completed and Peripheral IV Discontinued  Discharge: Condition: Good, Destination: Home . AVS Provided  Performed by:  Wyvonne Lenz, RN

## 2023-11-15 ENCOUNTER — Ambulatory Visit: Payer: Medicaid Other | Admitting: Family Medicine

## 2023-11-15 ENCOUNTER — Encounter: Payer: Self-pay | Admitting: Family Medicine

## 2023-11-15 ENCOUNTER — Other Ambulatory Visit: Payer: Self-pay | Admitting: Family Medicine

## 2023-11-15 VITALS — BP 126/78 | HR 114 | Ht 63.0 in | Wt 161.4 lb

## 2023-11-15 DIAGNOSIS — N924 Excessive bleeding in the premenopausal period: Secondary | ICD-10-CM | POA: Diagnosis not present

## 2023-11-15 DIAGNOSIS — R059 Cough, unspecified: Secondary | ICD-10-CM

## 2023-11-15 DIAGNOSIS — D500 Iron deficiency anemia secondary to blood loss (chronic): Secondary | ICD-10-CM

## 2023-11-15 DIAGNOSIS — D696 Thrombocytopenia, unspecified: Secondary | ICD-10-CM

## 2023-11-15 DIAGNOSIS — A539 Syphilis, unspecified: Secondary | ICD-10-CM | POA: Diagnosis not present

## 2023-11-15 MED ORDER — PENICILLIN G BENZATHINE 1200000 UNIT/2ML IM SUSY
1.2000 10*6.[IU] | PREFILLED_SYRINGE | Freq: Once | INTRAMUSCULAR | Status: AC
  Administered 2023-11-15: 1.2 10*6.[IU] via INTRAMUSCULAR

## 2023-11-15 MED ORDER — PANTOPRAZOLE SODIUM 40 MG PO TBEC: 40.0000 mg | DELAYED_RELEASE_TABLET | Freq: Every day | ORAL | 0 refills | Status: DC

## 2023-11-15 MED ORDER — MEGESTROL ACETATE 40 MG PO TABS: 40.0000 mg | ORAL_TABLET | Freq: Every day | ORAL | 0 refills | Status: DC

## 2023-11-15 NOTE — Progress Notes (Addendum)
    SUBJECTIVE:   CHIEF COMPLAINT / HPI:   Cough  Recently seen in clinic for viral URI. Normal CXR at the time, not given abx. Presenting today for follow up. Has been feeling better overall since. Still coughing, productive of non-bloody phlegm. Cough gradually improving. No recent fever, difficulty breathing, or chest pain.    IDA  [employer] levels have been better overall. No dizziness or lightheadedness. Has been doing IV iron given constipation 2/2 PO iron. Ran out of Megace about a month ago. Has recently had period once a month, bleeding for 5-7 days, requiring up to 4 menstrual products per day. Overall bleeding had not been as heavy as it was in the past.   PERTINENT  PMH / PSH: IDA, AUB, Syphillis   OBJECTIVE:   BP 126/78   Pulse (!) 114   Ht 5\' 3"  (1.6 m)   Wt 161 lb 6.4 oz (73.2 kg)   SpO2 100%   BMI 28.59 kg/m   General: Well-appearing. Resting comfortably in room. Neck: No cervical lymphadenopathy.  CV: Normal S1/S2. No extra heart sounds. Warm and well-perfused. Pulm: Breathing comfortably on room air. CTAB. No increased WOB. Abd: Soft, non-tender, non-distended. Skin:  Warm, dry. Psych: Pleasant and appropriate.    ASSESSMENT/PLAN:   Assessment & Plan Cough, unspecified type Patient appears to be recovering well overall from recent URI. Benign lung exam today. Unremarkable CXR on 11/04/23. Low concern for focal pneumonia at this time.  - Discussed course of viral illness - Discussed return precautions - Cont supportive care at home  Iron deficiency anemia due to chronic blood loss Hgb appears stable on last few months, last Hgb of 12.4 in Jan 2025. Previously taking Megace with relief. Follows with Hem/Onc. Negative endometrial biopsy with OBGYN.  - Cont IV iron per Hem/Onc - Refilled Megace 40 daily - Discussed and encouraged patient to see OBGYN again soon - Cont Hem/Onc fu  Syphilis Previously received 2 doses of penicillin months ago with 50%  reduction in titer down to 1:8 on 11/04/23. Last received penicillin in Nov 2024.  - Discussed and restarted 3x weekly penicillin treatment - first dose given today - Discussed and scheduled patient for weekly nurse visits for additional penicillin doses    Return for nurse visits as follows: 11/22/2023  3:00 PM [facility] NURSE [facility]  11/29/2023  3:00 PM [facility] NURSE [facility]   Ivery Quale, MD [facility]

## 2023-11-15 NOTE — Patient Instructions (Addendum)
 Thank you for visiting clinic today - it is always a joy to see you!  Your lung exam was reassuring today. You cough should continue to improve in the next week or so. If it gets worse, please let us know.   Please plan to see your OBGYN again soon for follow up. I have refilled your Megace and Protonix.  We will restart your 3 weeks of weekly antibiotic shots for adequate treatment. You received your first shot today and are scheduled for the following nurse visits ahead:  11/22/2023  3:00 PM [facility] NURSE [facility]  11/29/2023  3:00 PM [facility] NURSE [facility]   Reach out any time with any questions or concerns you may have - we are here for you!  Kiara Quale, MD [facility] [phone]

## 2023-11-15 NOTE — Assessment & Plan Note (Addendum)
 Previously received 2 doses of penicillin months ago with 50% reduction in titer down to 1:8 on 11/04/23. Last received penicillin in Nov 2024.  - Discussed and restarted 3x weekly penicillin treatment - first dose given today - Discussed and scheduled patient for weekly nurse visits for additional penicillin doses

## 2023-11-15 NOTE — Assessment & Plan Note (Signed)
 Hgb appears stable on last few months, last Hgb of 12.4 in Jan 2025. Previously taking Megace with relief. Follows with Hem/Onc. Negative endometrial biopsy with OBGYN.  - Cont IV iron per Hem/Onc - Refilled Megace 40 daily - Discussed and encouraged patient to see OBGYN again soon - Cont Hem/Onc fu

## 2023-11-19 ENCOUNTER — Ambulatory Visit: Payer: Medicaid Other | Admitting: *Deleted

## 2023-11-19 VITALS — BP 125/78 | HR 51 | Temp 98.5°F | Resp 16 | Ht 63.0 in | Wt 163.6 lb

## 2023-11-19 DIAGNOSIS — D649 Anemia, unspecified: Secondary | ICD-10-CM

## 2023-11-19 DIAGNOSIS — D509 Iron deficiency anemia, unspecified: Secondary | ICD-10-CM | POA: Diagnosis not present

## 2023-11-19 MED ORDER — HEPARIN SOD (PORK) LOCK FLUSH 100 UNIT/ML IV SOLN: 500.0000 [IU] | Freq: Once | INTRAVENOUS | Status: DC | PRN

## 2023-11-19 MED ORDER — ANTICOAGULANT SODIUM CITRATE 4% (200MG/5ML) IV SOLN: 5.0000 mL | Freq: Once | Status: DC | PRN

## 2023-11-19 MED ORDER — FAMOTIDINE IN NACL 20-0.9 MG/50ML-% IV SOLN: 20.0000 mg | Freq: Once | INTRAVENOUS | Status: DC | PRN

## 2023-11-19 MED ORDER — HEPARIN SOD (PORK) LOCK FLUSH 100 UNIT/ML IV SOLN
250.0000 [IU] | Freq: Once | INTRAVENOUS | Status: DC | PRN
Start: 2023-11-19 — End: 2023-11-19

## 2023-11-19 MED ORDER — SODIUM CHLORIDE 0.9 % IV SOLN: Freq: Once | INTRAVENOUS | Status: DC | PRN

## 2023-11-19 MED ORDER — ALBUTEROL SULFATE HFA 108 (90 BASE) MCG/ACT IN AERS: 2.0000 | INHALATION_SPRAY | Freq: Once | RESPIRATORY_TRACT | Status: DC | PRN

## 2023-11-19 MED ORDER — SODIUM CHLORIDE 0.9% FLUSH: 3.0000 mL | Freq: Once | INTRAVENOUS | Status: DC | PRN

## 2023-11-19 MED ORDER — EPINEPHRINE 0.3 MG/0.3ML IJ SOAJ: 0.3000 mg | Freq: Once | INTRAMUSCULAR | Status: DC | PRN

## 2023-11-19 MED ORDER — SODIUM CHLORIDE 0.9% FLUSH
10.0000 mL | Freq: Once | INTRAVENOUS | Status: DC | PRN
Start: 2023-11-19 — End: 2023-11-19

## 2023-11-19 MED ORDER — DIPHENHYDRAMINE HCL 50 MG/ML IJ SOLN: 50.0000 mg | Freq: Once | INTRAMUSCULAR | Status: DC | PRN

## 2023-11-19 MED ORDER — SODIUM CHLORIDE 0.9 % IV SOLN
300.0000 mg | Freq: Once | INTRAVENOUS | Status: AC
  Administered 2023-11-19: 300 mg via INTRAVENOUS
  Filled 2023-11-19: qty 15

## 2023-11-19 MED ORDER — ACETAMINOPHEN 325 MG PO TABS
650.0000 mg | ORAL_TABLET | Freq: Once | ORAL | Status: AC
Start: 2023-11-19 — End: 2023-11-19
  Administered 2023-11-19: 650 mg via ORAL
  Filled 2023-11-19: qty 2

## 2023-11-19 MED ORDER — DIPHENHYDRAMINE HCL 25 MG PO CAPS
25.0000 mg | ORAL_CAPSULE | Freq: Once | ORAL | Status: AC
  Administered 2023-11-19: 25 mg via ORAL
  Filled 2023-11-19: qty 1

## 2023-11-19 MED ORDER — ALTEPLASE 2 MG IJ SOLR: 2.0000 mg | Freq: Once | INTRAMUSCULAR | Status: DC | PRN

## 2023-11-19 MED ORDER — METHYLPREDNISOLONE SODIUM SUCC 125 MG IJ SOLR: 125.0000 mg | Freq: Once | INTRAMUSCULAR | Status: DC | PRN

## 2023-11-19 MED ORDER — IRON SUCROSE 300 MG IVPB - SIMPLE MED: 300.0000 mg | Freq: Once | Status: DC

## 2023-11-19 NOTE — Progress Notes (Signed)
 Diagnosis: Iron Deficiency Anemia  Provider:  Chilton Greathouse MD  Procedure: IV Infusion  IV Type: Peripheral, IV Location: R Antecubital  Venofer (Iron Sucrose), Dose: 360 mg  Infusion Start Time: 1130 am  Infusion Stop Time: 1333 pm  Post Infusion IV Care: Observation period completed and Peripheral IV Discontinued  Discharge: Condition: Good, Destination: Home . AVS Declined  Performed by:  Forrest Moron, RN

## 2023-11-22 ENCOUNTER — Ambulatory Visit (INDEPENDENT_AMBULATORY_CARE_PROVIDER_SITE_OTHER): Payer: Self-pay

## 2023-11-22 DIAGNOSIS — A539 Syphilis, unspecified: Secondary | ICD-10-CM

## 2023-11-22 MED ORDER — PENICILLIN G BENZATHINE 1200000 UNIT/2ML IM SUSY
1.2000 10*6.[IU] | PREFILLED_SYRINGE | Freq: Once | INTRAMUSCULAR | Status: AC
  Administered 2023-11-22: 1.2 10*6.[IU] via INTRAMUSCULAR

## 2023-11-25 ENCOUNTER — Telehealth: Payer: Self-pay

## 2023-11-25 NOTE — Progress Notes (Signed)
 Patient presents to nurse clinic for treatment of Syphilis. Precepted with Dr. Jennette Kettle, as future orders were not placed.   Received orders for 2.4 million units of penicillin x 3 weeks.   Patient denies history of allergic reaction to penicillin. Administered 1.2 million units penicillin in RUOQ and 1.2 million units penicillin in LUOQ.   Patient observed for 15 minutes post injection, no signs of adverse reaction.   Patient has scheduled follow up nurse visits for 3/21 and 12/06/23 to complete treatment.   Veronda Prude, RN

## 2023-11-25 NOTE — Telephone Encounter (Signed)
*  Delay in documentation*  Spoke with Dr. Jennette Kettle regarding treatment of patient's syphilis on 11/22/23.  On 11/15/23, patient received 1.2 million units of penicillin. Per Dr. Jennette Kettle, due to this not being full dose, patient will need to receive 3 weekly visits for 2.4 million units of penicillin.   Patient received first dose of 2.4 million units of penicillin on Friday, 11/22/23.  She has follow up visits on 11/29/23 and 12/06/23 to complete treatment.   Veronda Prude, RN

## 2023-11-29 ENCOUNTER — Ambulatory Visit: Payer: Self-pay

## 2023-11-29 DIAGNOSIS — A539 Syphilis, unspecified: Secondary | ICD-10-CM

## 2023-11-29 MED ORDER — PENICILLIN G BENZATHINE 1200000 UNIT/2ML IM SUSY
1.2000 10*6.[IU] | PREFILLED_SYRINGE | Freq: Once | INTRAMUSCULAR | Status: AC
  Administered 2023-11-29: 1.2 10*6.[IU] via INTRAMUSCULAR

## 2023-11-29 NOTE — Progress Notes (Signed)
 Patient presents to nurse clinic for treatment of Syphilis. Precepted with Dr. Jennette Kettle, as future orders were not placed.    Received orders for 2.4 million units of penicillin x 3 weeks.    Patient denies history of allergic reaction to penicillin. Administered 1.2 million units penicillin in RUOQ and 1.2 million units penicillin in LUOQ.    Patient observed for 15 minutes post injection, no signs of adverse reaction.    Patient has scheduled follow up nurse visit for 12/06/23 to complete treatment.

## 2023-12-06 ENCOUNTER — Ambulatory Visit (INDEPENDENT_AMBULATORY_CARE_PROVIDER_SITE_OTHER)

## 2023-12-06 DIAGNOSIS — A539 Syphilis, unspecified: Secondary | ICD-10-CM

## 2023-12-06 MED ORDER — PENICILLIN G BENZATHINE 1200000 UNIT/2ML IM SUSY
1.2000 10*6.[IU] | PREFILLED_SYRINGE | Freq: Once | INTRAMUSCULAR | Status: AC
  Administered 2023-12-06: 1.2 10*6.[IU] via INTRAMUSCULAR

## 2023-12-06 MED ORDER — PENICILLIN G BENZATHINE 1200000 UNIT/2ML IM SUSY
1.2000 10*6.[IU] | PREFILLED_SYRINGE | Freq: Once | INTRAMUSCULAR | Status: AC
Start: 2023-12-06 — End: 2023-12-06
  Administered 2023-12-06: 1.2 10*6.[IU] via INTRAMUSCULAR

## 2023-12-09 NOTE — Progress Notes (Signed)
 Patient presents to clinic for 3/3 weekly penicillin injection. Verbal orders were previously given by Dr. Jennette Kettle for 2.4 million units.    Patient denies history of allergic reaction to penicillin. Administered 1.2 million units penicillin in RUOQ and 1.2 million units penicillin in LUOQ.    Patient observed for 15 minutes post injection, no signs of adverse reaction.   Precepted with Dr. Deirdre Priest who advised follow up testing again in 5 months. Advised patient of recommendation and asked that she call our office around July, as our schedule is not out that far.   Patient has no further questions at this time.   Veronda Prude, RN

## 2023-12-18 ENCOUNTER — Encounter (HOSPITAL_COMMUNITY): Payer: Self-pay | Admitting: *Deleted

## 2023-12-18 ENCOUNTER — Emergency Department (HOSPITAL_COMMUNITY)
Admission: EM | Admit: 2023-12-18 | Discharge: 2023-12-19 | Disposition: A | Discharge status: Home / Self Care | Attending: Emergency Medicine | Admitting: Emergency Medicine

## 2023-12-18 ENCOUNTER — Other Ambulatory Visit: Payer: Self-pay

## 2023-12-18 DIAGNOSIS — R519 Headache, unspecified: Secondary | ICD-10-CM | POA: Diagnosis present

## 2023-12-18 LAB — URINALYSIS, MICROSCOPIC (REFLEX): RBC / HPF: >50 RBC/hpf (ref 0–5)

## 2023-12-18 LAB — CBC
HCT: 37.1 % (ref 36.0–46.0)
Hemoglobin: 11.6 g/dL — ABNORMAL LOW (ref 12.0–15.0)
MCH: 27.4 pg (ref 26.0–34.0)
MCHC: 31.3 g/dL (ref 30.0–36.0)
MCV: 87.5 fL (ref 80.0–100.0)
Platelets: 279 10*3/uL (ref 150–400)
RBC: 4.24 MIL/uL (ref 3.87–5.11)
RDW: 18.6 % — ABNORMAL HIGH (ref 11.5–15.5)
WBC: 6.4 10*3/uL (ref 4.0–10.5)
nRBC: 0 % (ref 0.0–0.2)

## 2023-12-18 LAB — HCG, SERUM, QUALITATIVE: Preg, Serum: NEGATIVE

## 2023-12-18 LAB — URINALYSIS, ROUTINE W REFLEX MICROSCOPIC

## 2023-12-18 LAB — COMPREHENSIVE METABOLIC PANEL WITH GFR
ALT: 12 U/L (ref 0–44)
AST: 14 U/L — ABNORMAL LOW (ref 15–41)
Albumin: 3.6 g/dL (ref 3.5–5.0)
Alkaline Phosphatase: 69 U/L (ref 38–126)
Anion gap: 10 (ref 5–15)
BUN: 13 mg/dL (ref 6–20)
CO2: 23 mmol/L (ref 22–32)
Calcium: 9.3 mg/dL (ref 8.9–10.3)
Chloride: 103 mmol/L (ref 98–111)
Creatinine, Ser: 0.63 mg/dL (ref 0.44–1.00)
GFR, Estimated: >60 mL/min (ref >60)
Glucose, Bld: 105 mg/dL — ABNORMAL HIGH (ref 70–99)
Potassium: 3.4 mmol/L — ABNORMAL LOW (ref 3.5–5.1)
Sodium: 136 mmol/L (ref 135–145)
Total Bilirubin: 0.5 mg/dL (ref 0.0–1.2)
Total Protein: 7.4 g/dL (ref 6.5–8.1)

## 2023-12-18 LAB — TYPE AND SCREEN
ABO/RH(D): A POS
Antibody Screen: NEGATIVE

## 2023-12-18 NOTE — ED Notes (Signed)
 EKG complete. Extra copy in triage.

## 2023-12-18 NOTE — ED Triage Notes (Signed)
 The pt has a headache and is seeing stars she has a tendency to have a low hgb and has been transfused in the past    she has had these symptoms  since last week

## 2023-12-19 ENCOUNTER — Emergency Department (HOSPITAL_COMMUNITY)

## 2023-12-19 ENCOUNTER — Other Ambulatory Visit (HOSPITAL_COMMUNITY): Payer: Self-pay | Admitting: Interventional Radiology

## 2023-12-19 DIAGNOSIS — R519 Headache, unspecified: Secondary | ICD-10-CM

## 2023-12-19 LAB — SEDIMENTATION RATE: Sed Rate: 13 mm/h (ref 0–22)

## 2023-12-19 MED ORDER — PROCHLORPERAZINE EDISYLATE 10 MG/2ML IJ SOLN
10.0000 mg | Freq: Once | INTRAMUSCULAR | Status: AC
  Administered 2023-12-19: 10 mg via INTRAVENOUS
  Filled 2023-12-19: qty 2

## 2023-12-19 MED ORDER — LACTATED RINGERS IV BOLUS
1000.0000 mL | Freq: Once | INTRAVENOUS | Status: AC
  Administered 2023-12-19: 1000 mL via INTRAVENOUS

## 2023-12-19 MED ORDER — IOHEXOL 350 MG/ML SOLN
75.0000 mL | Freq: Once | INTRAVENOUS | Status: AC | PRN
Start: 2023-12-19 — End: 2023-12-19
  Administered 2023-12-19: 75 mL via INTRAVENOUS

## 2023-12-19 MED ORDER — DEXAMETHASONE SODIUM PHOSPHATE 10 MG/ML IJ SOLN
10.0000 mg | Freq: Once | INTRAMUSCULAR | Status: AC
  Administered 2023-12-19: 10 mg via INTRAVENOUS
  Filled 2023-12-19: qty 1

## 2023-12-19 MED ORDER — DIPHENHYDRAMINE HCL 50 MG/ML IJ SOLN
25.0000 mg | Freq: Once | INTRAMUSCULAR | Status: AC
  Administered 2023-12-19: 25 mg via INTRAVENOUS
  Filled 2023-12-19: qty 1

## 2023-12-19 NOTE — ED Notes (Signed)
 2 unsuccessful IV attempts.

## 2023-12-19 NOTE — ED Notes (Signed)
 Discharge instructions reviewed.   Opportunity for questions and concerns provided.   Alert, oriented and ambulatory. Displays no signs of distress.

## 2023-12-19 NOTE — ED Provider Notes (Signed)
 [place] EMERGENCY DEPARTMENT AT [facility] Provider Note   CSN: [id number] Arrival date & time: 12/18/23  1909     History  Chief Complaint  Patient presents with   Headache   seeing spots    Kiara [name] is a 48 y.o. female.  48 year old female without significant past medical history presents the ER today secondary to an episode of lightheadedness, right sided headache and seeing some black spots after standing up from bending over too fast.  Never had any symptoms like this before.  States she has had headaches in the past.  She states that her vision was only abnormal for a few seconds and improved significantly and at this time is having no visual changes.  This is stars in the triage note however patient specifically says that was not shimmering/bright stars but rather dark spots.  No trauma.  Still has a bit of right-sided headache.  She has a history of headaches and has never had any imaging done.  No other neurologic changes during it.   Headache      Home Medications Prior to Admission medications   Medication Sig Start Date End Date Taking? Authorizing Provider  ferrous sulfate 325 (65 FE) MG tablet Take 1 tablet (325 mg total) by mouth every other day. 05/24/23   Lincoln Brigham, MD  megestrol (MEGACE) 40 MG tablet Take 1 tablet (40 mg total) by mouth daily. 11/15/23   Ivery Quale, MD  pantoprazole (PROTONIX) 40 MG tablet TAKE 1 TABLET(40 MG) BY MOUTH DAILY 11/15/23   Ivery Quale, MD      Allergies    Shellfish allergy    Review of Systems   Review of Systems  Neurological:  Positive for headaches.    Physical Exam Updated Vital Signs BP 123/81   Pulse 74   Temp 98.3 F (36.8 C)   Resp 16   Ht 5\' 3"  (1.6 m)   Wt 74.2 kg   LMP 12/18/2023   SpO2 100%   BMI 28.98 kg/m  Physical Exam Vitals and nursing note reviewed.  Constitutional:      Appearance: She is well-developed.  HENT:     Head: Normocephalic and atraumatic.  Cardiovascular:      Rate and Rhythm: Normal rate and regular rhythm.  Pulmonary:     Effort: No respiratory distress.     Breath sounds: No stridor.  Abdominal:     General: There is no distension.  Musculoskeletal:     Cervical back: Normal range of motion.  Neurological:     Mental Status: She is alert. Mental status is at baseline.     Comments: No altered mental status, able to give full seemingly accurate history.  Face is symmetric, EOM's intact, pupils equal and reactive, vision intact, tongue and uvula midline without deviation. Upper and Lower extremity motor 5/5, intact pain perception in distal extremities, 2+ reflexes in biceps, patella and achilles tendons. Able to perform finger to nose normal with both hands. Walks without assistance or evident ataxia.       ED Results / Procedures / Treatments   Labs (all labs ordered are listed, but only abnormal results are displayed) Labs Reviewed  COMPREHENSIVE METABOLIC PANEL WITH GFR - Abnormal; Notable for the following components:      Result Value   Potassium 3.4 (*)    Glucose, Bld 105 (*)    AST 14 (*)    All other components within normal limits  CBC - Abnormal; Notable for  the following components:   Hemoglobin 11.6 (*)    RDW 18.6 (*)    All other components within normal limits  URINALYSIS, ROUTINE W REFLEX MICROSCOPIC - Abnormal; Notable for the following components:   Color, Urine RED (*)    APPearance TURBID (*)    Glucose, UA   (*)    Value: TEST NOT REPORTED DUE TO COLOR INTERFERENCE OF URINE PIGMENT   Hgb urine dipstick   (*)    Value: TEST NOT REPORTED DUE TO COLOR INTERFERENCE OF URINE PIGMENT   Bilirubin Urine   (*)    Value: TEST NOT REPORTED DUE TO COLOR INTERFERENCE OF URINE PIGMENT   Ketones, ur   (*)    Value: TEST NOT REPORTED DUE TO COLOR INTERFERENCE OF URINE PIGMENT   Protein, ur   (*)    Value: TEST NOT REPORTED DUE TO COLOR INTERFERENCE OF URINE PIGMENT   Nitrite   (*)    Value: TEST NOT REPORTED DUE TO  COLOR INTERFERENCE OF URINE PIGMENT   Leukocytes,Ua   (*)    Value: TEST NOT REPORTED DUE TO COLOR INTERFERENCE OF URINE PIGMENT   All other components within normal limits  URINALYSIS, MICROSCOPIC (REFLEX) - Abnormal; Notable for the following components:   Bacteria, UA FIELD OBSCURED BY RBC'S (*)    All other components within normal limits  HCG, SERUM, QUALITATIVE  SEDIMENTATION RATE  TYPE AND SCREEN    EKG None  Radiology CT ANGIO HEAD NECK W WO CM Result Date: 12/19/2023 CLINICAL DATA:  Initial evaluation for acute headache, neuro deficit. EXAM: CT ANGIOGRAPHY HEAD AND NECK WITH AND WITHOUT CONTRAST TECHNIQUE: Multidetector CT imaging of the head and neck was performed using the standard protocol during bolus administration of intravenous contrast. Multiplanar CT image reconstructions and MIPs were obtained to evaluate the vascular anatomy. Carotid stenosis measurements (when applicable) are obtained utilizing NASCET criteria, using the distal internal carotid diameter as the denominator. RADIATION DOSE REDUCTION: This exam was performed according to the departmental dose-optimization program which includes automated exposure control, adjustment of the mA and/or kV according to patient size and/or use of iterative reconstruction technique. CONTRAST:  75mL OMNIPAQUE IOHEXOL 350 MG/ML SOLN COMPARISON:  None Available. FINDINGS: CT HEAD FINDINGS Brain: Cerebral volume within normal limits for patient age. No acute intracranial hemorrhage. No acute large vessel territory infarct. No mass lesion, midline shift, or mass effect. Ventricles are normal in size without hydrocephalus. No extra-axial fluid collection. Vascular: No abnormal hyperdense vessel. Skull: Scalp soft tissues demonstrate no acute abnormality. Calvarium intact. Sinuses/Orbits: Globes and orbital soft tissues within normal limits. Visualized paranasal sinuses are largely clear. No significant mastoid effusion. CTA NECK FINDINGS  Aortic arch: Standard branching. Imaged portion shows no evidence of aneurysm or dissection. No significant stenosis of the major arch vessel origins. Right carotid system: Right common and internal carotid arteries are patent without dissection or stenosis. Subtle transversely oriented defect present at the right carotid bulb/origin of the cervical right ICA, consistent with a small carotid web (series 13, image 68). No significant stenosis. Left carotid system: Left common and internal carotid arteries are patent without stenosis or dissection. Vertebral arteries: Both vertebral arteries arise from subclavian arteries. No proximal subclavian artery stenosis. Left vertebral artery dominant. Vertebral arteries are patent without stenosis or dissection. Skeleton: No worrisome osseous lesions. Mild cervical spondylosis for age. Few scattered periapical lucencies noted about the teeth. Other neck: No other acute finding. Upper chest: No other acute finding. Review of the MIP  images confirms the above findings CTA HEAD FINDINGS Anterior circulation: Both internal carotid arteries widely patent to the termini without stenosis. A1 segments widely patent. Normal anterior communicating artery complex. Both anterior cerebral arteries widely patent to their distal aspects without stenosis. No M1 stenosis or occlusion. Normal MCA bifurcations. Distal MCA branches well perfused and symmetric. Posterior circulation: Both V4 segments patent without significant stenosis. Left vertebral artery dominant. Left PICA patent. Right PICA not well seen. Basilar patent without stenosis. Superior cerebellar and posterior cerebral arteries patent bilaterally. Venous sinuses: Patent allowing for timing the contrast bolus. Anatomic variants: As above.  No aneurysm. Review of the MIP images confirms the above findings IMPRESSION: 1. Negative CTA of the head and neck. No large vessel occlusion, hemodynamically significant stenosis, or other  acute vascular abnormality. No aneurysm. 2. Subtle transversely oriented defect at the right carotid bulb/origin of the cervical right ICA, consistent with a small carotid web. No significant stenosis. 3. No other acute intracranial abnormality. Electronically Signed   By: Rise Mu M.D.   On: 12/19/2023 04:21    Procedures Procedures    Medications Ordered in ED Medications  prochlorperazine (COMPAZINE) injection 10 mg (10 mg Intravenous Given 12/19/23 0207)  diphenhydrAMINE (BENADRYL) injection 25 mg (25 mg Intravenous Given 12/19/23 0206)  dexamethasone (DECADRON) injection 10 mg (10 mg Intravenous Given 12/19/23 0208)  lactated ringers bolus 1,000 mL (0 mLs Intravenous Stopped 12/19/23 0420)  iohexol (OMNIPAQUE) 350 MG/ML injection 75 mL (75 mLs Intravenous Contrast Given 12/19/23 0228)    ED Course/ Medical Decision Making/ A&P                                 Medical Decision Making Amount and/or Complexity of Data Reviewed Labs: ordered. Radiology: ordered.  Risk Prescription drug management.   Secondary to chronic headaches now with worsening symptoms checked an ESR which was within normal limits so unlikely to be temporal arteritis.  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  Discussed this with neurology did not feel any treatment necessary but to follow-up with Dr. Corliss Skains. Patient aware. Still neuro intact. Headache resolved. Stable for discharge.    Final Clinical Impression(s) / ED Diagnoses Final diagnoses:  Nonintractable headache, unspecified chronicity pattern, unspecified headache type    Rx / DC Orders ED Discharge Orders     None         Ghali Morissette, Barbara Cower, MD 12/19/23 [phone]

## 2023-12-30 ENCOUNTER — Ambulatory Visit (INDEPENDENT_AMBULATORY_CARE_PROVIDER_SITE_OTHER): Admitting: Student

## 2023-12-30 VITALS — BP 120/70 | HR 106 | Temp 98.8°F | Ht 63.0 in | Wt 164.4 lb

## 2023-12-30 DIAGNOSIS — K047 Periapical abscess without sinus: Secondary | ICD-10-CM | POA: Diagnosis present

## 2023-12-30 MED ORDER — AMOXICILLIN-POT CLAVULANATE 875-125 MG PO TABS
1.0000 | ORAL_TABLET | Freq: Two times a day (BID) | ORAL | 0 refills | Status: AC
Start: 2023-12-30 — End: 2024-01-06

## 2023-12-30 NOTE — Progress Notes (Signed)
    SUBJECTIVE:   CHIEF COMPLAINT / HPI: Dental abscess  Discussed the use of AI scribe software for clinical note transcription with the patient, who gave verbal consent to proceed.  History of Present Illness The patient presents with recurrent tooth pain, which she describes as an abscess. She reports having three abscesses on the right side of her mouth, two on the outside and one on the inside. She has noticed drainage from the abscesses but denies having fevers or vomiting. She reports that the pain has made it difficult to eat, as her gum is becoming increasingly sore. She denies any difficulty breathing or swallowing. The patient reports that this issue has been ongoing for about a week, initially presenting as a sore gum before the abscesses developed. She has a history of a similar issue on the other side of her mouth, which resolved on its own. She has been unable to find a dentist who accepts Medicaid.  PERTINENT  PMH / PSH: thrombocytopenia, anemia, syphilis  OBJECTIVE:   BP 120/70   Pulse (!) 106   Temp 98.8 F (37.1 C) (Oral)   Ht 5\' 3"  (1.6 m)   Wt 164 lb 6 oz (74.6 kg)   LMP 12/18/2023   SpO2 97%   BMI 29.12 kg/m   General: Well appearing, NAD, awake, alert, responsive to questions Head: Normocephalic atraumatic, no facial swelling, no oropharyngeal swelling, 3 periapical abscesses of right lower molars Neck: No lymphadenopathy, no facial swelling       ASSESSMENT/PLAN:   Assessment & Plan Periapical abscess Three abscesses vs cysts on the right side, chronic inflammation suspected. No systemic sxs, no drainable pockets. No signs of Ludwig angina. - Prescribe augmentin  BID x 7 days - Provide list of Medicaid-accepting dentists for follow-up, [facility] free clinic info - Advise daily brushing and flossing - Pain control with tylenol  and orajel - ED/return precautions discussed   Kiara Kidd, MD [facility]

## 2023-12-30 NOTE — Patient Instructions (Addendum)
 At this time there is no abscess to be drained. You were prescribed an antibiotic (augmentin ). Please take this exactly as directed and do not stop taking it until the entire course of medicine is finished, even if you begin to feel better before finishing the course.   Please take tylenol  1000 mg every 6 hours as needed for pain and orajel over the counter  Schedule an appointment with your dentist or call local dentists to see if they take your insurance or can do a payment payment plan.  [facility] [employer] [facility] are options. Consider [facility] prior to your dental appointment.   Consider the [facility] dental school  free clinic operates from 6-9 pm on select Wednesdays:  [facility] Ground Floor, [place] [address] [place], [place] [address] Parking Location: [place]  Go to ED if fevers, trouble swallowing, drooling, trouble breathing

## 2024-01-02 ENCOUNTER — Inpatient Hospital Stay: Payer: Medicaid Other | Admitting: Internal Medicine

## 2024-01-02 ENCOUNTER — Other Ambulatory Visit: Payer: Self-pay | Admitting: Internal Medicine

## 2024-01-02 ENCOUNTER — Inpatient Hospital Stay: Payer: Medicaid Other | Attending: Internal Medicine

## 2024-01-02 VITALS — BP 124/83 | HR 79 | Temp 97.7°F | Resp 16 | Ht 63.0 in | Wt 164.5 lb

## 2024-01-02 DIAGNOSIS — D649 Anemia, unspecified: Secondary | ICD-10-CM | POA: Diagnosis not present

## 2024-01-02 DIAGNOSIS — D500 Iron deficiency anemia secondary to blood loss (chronic): Secondary | ICD-10-CM

## 2024-01-02 DIAGNOSIS — D696 Thrombocytopenia, unspecified: Secondary | ICD-10-CM | POA: Diagnosis not present

## 2024-01-02 DIAGNOSIS — N920 Excessive and frequent menstruation with regular cycle: Secondary | ICD-10-CM | POA: Diagnosis not present

## 2024-01-02 LAB — CBC WITH DIFFERENTIAL (CANCER CENTER ONLY)
Abs Immature Granulocytes: 0.01 10*3/uL (ref 0.00–0.07)
Basophils Absolute: 0 10*3/uL (ref 0.0–0.1)
Basophils Relative: 1 %
Eosinophils Absolute: 0.1 10*3/uL (ref 0.0–0.5)
Eosinophils Relative: 2 %
HCT: 35.2 % — ABNORMAL LOW (ref 36.0–46.0)
Hemoglobin: 11.1 g/dL — ABNORMAL LOW (ref 12.0–15.0)
Immature Granulocytes: 0 %
Lymphocytes Relative: 23 %
Lymphs Abs: 1.2 10*3/uL (ref 0.7–4.0)
MCH: 27.1 pg (ref 26.0–34.0)
MCHC: 31.5 g/dL (ref 30.0–36.0)
MCV: 85.9 fL (ref 80.0–100.0)
Monocytes Absolute: 0.3 10*3/uL (ref 0.1–1.0)
Monocytes Relative: 5 %
Neutro Abs: 3.7 10*3/uL (ref 1.7–7.7)
Neutrophils Relative %: 69 %
Platelet Count: 296 10*3/uL (ref 150–400)
RBC: 4.1 MIL/uL (ref 3.87–5.11)
RDW: 17.9 % — ABNORMAL HIGH (ref 11.5–15.5)
WBC Count: 5.4 10*3/uL (ref 4.0–10.5)
nRBC: 0 % (ref 0.0–0.2)

## 2024-01-02 LAB — IRON AND IRON BINDING CAPACITY (CC-WL,HP ONLY)
Iron: 21 ug/dL — ABNORMAL LOW (ref 28–170)
Saturation Ratios: 6 % — ABNORMAL LOW (ref 10.4–31.8)
TIBC: 385 ug/dL (ref 250–450)
UIBC: 364 ug/dL (ref 148–442)

## 2024-01-02 LAB — FERRITIN: Ferritin: 13 ng/mL (ref 11–307)

## 2024-01-02 NOTE — Progress Notes (Signed)
 [facility] [phone]   [phone]  OFFICE PROGRESS NOTE  Carey Chapman, MD [address] [place] [address]  DIAGNOSIS:  1) iron  deficiency anemia secondary to menorrhagia, improved 2) thrombocytopenia, resolved  PRIOR THERAPY: PRBCs transfusion in addition to iron  infusion with Venofer  at her last infusion was in February 2025.   CURRENT THERAPY: Ferrous sulfate  325 mg p.o. daily  INTERVAL HISTORY: Kiara [name] 48 y.o. female returns to the clinic today for hospital follow-up visit. Discussed the use of AI scribe software for clinical note transcription with the patient, who gave verbal consent to proceed.  History of Present Illness   Kiara [name] is a 48 year old female with iron  deficiency anemia secondary to menorrhagia who presents for evaluation and repeat blood work.  She has a history of iron  deficiency anemia secondary to menorrhagia and thrombocytopenia. She received iron  infusion with Venofer , with the last treatment occurring in February 2025. She feels better after the infusion compared to oral iron  supplements, which she cannot tolerate due to gastrointestinal discomfort.  Her hemoglobin levels have started to decrease again, with a recent hemoglobin level of 11.1 and hematocrit of 35.2. She visited the hospital a couple of weeks ago, where it was noted that her levels had decreased slightly.  Her menstrual periods remain heavy, contributing to her anemia. She denies taking any oral iron  supplements due to intolerance.  Occasional dizziness and balance issues are present. No significant fatigue or recent cravings for ice.       MEDICAL HISTORY: Past Medical History:  Diagnosis Date   Anemia    Overweight 12/13/2021   Vitamin D deficiency 12/13/2021    ALLERGIES:  is allergic to shellfish allergy.  MEDICATIONS:  Current Outpatient Medications  Medication Sig Dispense Refill   amoxicillin -clavulanate  (AUGMENTIN ) 875-125 MG tablet Take 1 tablet by mouth 2 (two) times daily for 7 days. 14 tablet 0   ferrous sulfate  325 (65 FE) MG tablet Take 1 tablet (325 mg total) by mouth every other day. 100 tablet 0   megestrol  (MEGACE ) 40 MG tablet Take 1 tablet (40 mg total) by mouth daily. 60 tablet 0   pantoprazole  (PROTONIX ) 40 MG tablet TAKE 1 TABLET(40 MG) BY MOUTH DAILY 90 tablet 3   Current Facility-Administered Medications  Medication Dose Route Frequency Provider Last Rate Last Admin   diclofenac  Sodium (VOLTAREN ) 1 % topical gel 2 g  2 g Topical Daily PRN         SURGICAL HISTORY: No past surgical history on file.  REVIEW OF SYSTEMS:  A comprehensive review of systems was negative except for: Constitutional: positive for fatigue Neurological: positive for dizziness   PHYSICAL EXAMINATION: General appearance: alert, cooperative, fatigued, and no distress Head: Normocephalic, without obvious abnormality, atraumatic Neck: no adenopathy, no JVD, supple, symmetrical, trachea midline, and thyroid not enlarged, symmetric, no tenderness/mass/nodules Lymph nodes: Cervical, supraclavicular, and axillary nodes normal. Resp: clear to auscultation bilaterally Back: symmetric, no curvature. ROM normal. No CVA tenderness. Cardio: regular rate and rhythm, S1, S2 normal, no murmur, click, rub or gallop GI: soft, non-tender; bowel sounds normal; no masses,  no organomegaly Extremities: extremities normal, atraumatic, no cyanosis or edema  ECOG PERFORMANCE STATUS: 1 - Symptomatic but completely ambulatory  Blood pressure 124/83, pulse 79, temperature 97.7 F (36.5 C), temperature source Temporal, resp. rate 16, height 5\' 3"  (1.6 m), weight 164 lb 8 oz (74.6 kg), last menstrual period 12/18/2023, SpO2 100%.  LABORATORY DATA: Lab Results  Component Value Date   WBC 5.4 01/02/2024   HGB 11.1 (L) 01/02/2024   HCT 35.2 (L) 01/02/2024   MCV 85.9 01/02/2024   PLT 296 01/02/2024      Chemistry       Component Value Date/Time   NA 136 12/18/2023 1931   K 3.4 (L) 12/18/2023 1931   CL 103 12/18/2023 1931   CO2 23 12/18/2023 1931   BUN 13 12/18/2023 1931   CREATININE 0.63 12/18/2023 1931   CREATININE 0.92 06/27/2023 1128      Component Value Date/Time   CALCIUM 9.3 12/18/2023 1931   ALKPHOS 69 12/18/2023 1931   AST 14 (L) 12/18/2023 1931   AST 13 (L) 06/27/2023 1128   ALT 12 12/18/2023 1931   ALT 12 06/27/2023 1128   BILITOT 0.5 12/18/2023 1931   BILITOT 0.4 06/27/2023 1128       RADIOGRAPHIC STUDIES: CT ANGIO HEAD NECK W WO CM Result Date: 12/19/2023 CLINICAL DATA:  Initial evaluation for acute headache, neuro deficit. EXAM: CT ANGIOGRAPHY HEAD AND NECK WITH AND WITHOUT CONTRAST TECHNIQUE: Multidetector CT imaging of the head and neck was performed using the standard protocol during bolus administration of intravenous contrast. Multiplanar CT image reconstructions and MIPs were obtained to evaluate the vascular anatomy. Carotid stenosis measurements (when applicable) are obtained utilizing NASCET criteria, using the distal internal carotid diameter as the denominator. RADIATION DOSE REDUCTION: This exam was performed according to the departmental dose-optimization program which includes automated exposure control, adjustment of the mA and/or kV according to patient size and/or use of iterative reconstruction technique. CONTRAST:  75mL OMNIPAQUE  IOHEXOL  350 MG/ML SOLN COMPARISON:  None Available. FINDINGS: CT HEAD FINDINGS Brain: Cerebral volume within normal limits for patient age. No acute intracranial hemorrhage. No acute large vessel territory infarct. No mass lesion, midline shift, or mass effect. Ventricles are normal in size without hydrocephalus. No extra-axial fluid collection. Vascular: No abnormal hyperdense vessel. Skull: Scalp soft tissues demonstrate no acute abnormality. Calvarium intact. Sinuses/Orbits: Globes and orbital soft tissues within normal limits. Visualized  paranasal sinuses are largely clear. No significant mastoid effusion. CTA NECK FINDINGS Aortic arch: Standard branching. Imaged portion shows no evidence of aneurysm or dissection. No significant stenosis of the major arch vessel origins. Right carotid system: Right common and internal carotid arteries are patent without dissection or stenosis. Subtle transversely oriented defect present at the right carotid bulb/origin of the cervical right ICA, consistent with a small carotid web (series 13, image 68). No significant stenosis. Left carotid system: Left common and internal carotid arteries are patent without stenosis or dissection. Vertebral arteries: Both vertebral arteries arise from subclavian arteries. No proximal subclavian artery stenosis. Left vertebral artery dominant. Vertebral arteries are patent without stenosis or dissection. Skeleton: No worrisome osseous lesions. Mild cervical spondylosis for age. Few scattered periapical lucencies noted about the teeth. Other neck: No other acute finding. Upper chest: No other acute finding. Review of the MIP images confirms the above findings CTA HEAD FINDINGS Anterior circulation: Both internal carotid arteries widely patent to the termini without stenosis. A1 segments widely patent. Normal anterior communicating artery complex. Both anterior cerebral arteries widely patent to their distal aspects without stenosis. No M1 stenosis or occlusion. Normal MCA bifurcations. Distal MCA branches well perfused and symmetric. Posterior circulation: Both V4 segments patent without significant stenosis. Left vertebral artery dominant. Left PICA patent. Right PICA not well seen. Basilar patent without stenosis. Superior cerebellar and posterior cerebral arteries patent bilaterally. Venous sinuses:  Patent allowing for timing the contrast bolus. Anatomic variants: As above.  No aneurysm. Review of the MIP images confirms the above findings IMPRESSION: 1. Negative CTA of the head  and neck. No large vessel occlusion, hemodynamically significant stenosis, or other acute vascular abnormality. No aneurysm. 2. Subtle transversely oriented defect at the right carotid bulb/origin of the cervical right ICA, consistent with a small carotid web. No significant stenosis. 3. No other acute intracranial abnormality. Electronically Signed   By: Virgia Griffins M.D.   On: 12/19/2023 04:21    ASSESSMENT AND PLAN: This is a very pleasant 48 years old African-American female with a history of iron  deficiency anemia secondary to menorrhagia status post PRBCs transfusion and iron  infusion during her hospitalization.  She also received iron  infusion with Venofer  in February 2025.  She is feeling fine today with no concerning complaints except for the mild fatigue and occasional dizzy spells.    Iron  deficiency anemia secondary to menorrhagia Iron  deficiency anemia secondary to menorrhagia with a history of requiring iron  infusions. Hemoglobin is 11.1, and hematocrit is 35.2, indicating a decrease since the last evaluation. She reports continued menorrhagia and cannot tolerate oral iron  supplements due to gastrointestinal discomfort. Occasional dizziness and balance issues are present, but no significant fatigue or pica symptoms. - Await results of iron  studies to determine the need for additional iron  infusion. - If iron  studies indicate low levels, schedule iron  infusion for next week. - If iron  levels are adequate, no immediate infusion is needed. - Follow-up in three months.  Thrombocytopenia Resolved.   The patient was advised to call immediately if she has any concerning symptoms in the interval.  The patient voices understanding of current disease status and treatment options and is in agreement with the current care plan.  All questions were answered. The patient knows to call the clinic with any problems, questions or concerns. We can certainly see the patient much sooner if  necessary. The total time spent in the appointment was 30 minutes.  Disclaimer: This note was dictated with voice recognition software. Similar sounding words can inadvertently be transcribed and may not be corrected upon review.

## 2024-01-03 ENCOUNTER — Telehealth: Payer: Self-pay

## 2024-01-03 NOTE — Telephone Encounter (Signed)
 Dr. Marguerita Shih, patient will be scheduled as soon as possible.  Auth Submission: NO AUTH NEEDED Site of care: Site of care: [facility] Payer: [employer]  Medication & CPT/J Code(s) submitted: Venofer  (Iron  Sucrose) J1756 Route of submission (phone, fax, portal):  Phone # Fax # Auth type: Buy/Bill PB Units/visits requested: 300mg  x 3 doses Reference number:  Approval from: 01/03/24 to 06/04/24

## 2024-01-08 ENCOUNTER — Ambulatory Visit (HOSPITAL_COMMUNITY): Admission: RE | Admit: 2024-01-08 | Source: Ambulatory Visit

## 2024-01-16 ENCOUNTER — Ambulatory Visit (INDEPENDENT_AMBULATORY_CARE_PROVIDER_SITE_OTHER)

## 2024-01-16 VITALS — BP 105/71 | HR 82 | Temp 97.9°F | Resp 18 | Ht 63.0 in | Wt 162.8 lb

## 2024-01-16 DIAGNOSIS — D649 Anemia, unspecified: Secondary | ICD-10-CM

## 2024-01-16 MED ORDER — SODIUM CHLORIDE 0.9 % IV SOLN
300.0000 mg | INTRAVENOUS | Status: DC
  Administered 2024-01-16: 300 mg via INTRAVENOUS
  Filled 2024-01-16: qty 15

## 2024-01-16 NOTE — Progress Notes (Signed)
 Diagnosis: Chronic Anemia  Provider:  Phyllis Breeze MD  Procedure: IV Infusion  IV Type: Peripheral, IV Location: L Forearm  Venofer  (Iron  Sucrose), Dose: 300 mg  Infusion Start Time: 1107  Infusion Stop Time: 1245  Post Infusion IV Care: Observation period completed  Discharge: Condition: Good, Destination: Home . AVS Provided  Performed by:  Natividad Balding, RN

## 2024-01-17 ENCOUNTER — Ambulatory Visit (HOSPITAL_COMMUNITY)
Admission: RE | Admit: 2024-01-17 | Discharge: 2024-01-17 | Disposition: A | Discharge status: Home / Self Care | Source: Ambulatory Visit | Attending: Interventional Radiology | Admitting: Interventional Radiology

## 2024-01-17 DIAGNOSIS — R519 Headache, unspecified: Secondary | ICD-10-CM

## 2024-01-20 HISTORY — PX: IR RADIOLOGIST EVAL & MGMT: IMG5224

## 2024-01-23 ENCOUNTER — Ambulatory Visit: Admitting: *Deleted

## 2024-01-23 VITALS — BP 118/78 | HR 87 | Temp 97.8°F | Resp 18 | Ht 63.0 in | Wt 165.2 lb

## 2024-01-23 DIAGNOSIS — D509 Iron deficiency anemia, unspecified: Secondary | ICD-10-CM

## 2024-01-23 DIAGNOSIS — D649 Anemia, unspecified: Secondary | ICD-10-CM | POA: Diagnosis not present

## 2024-01-23 MED ORDER — SODIUM CHLORIDE 0.9 % IV SOLN
300.0000 mg | INTRAVENOUS | Status: DC
  Administered 2024-01-23: 300 mg via INTRAVENOUS
  Filled 2024-01-23: qty 15

## 2024-01-23 NOTE — Progress Notes (Signed)
 Diagnosis: Iron  Deficiency Anemia  Provider:  Mannam, Praveen MD  Procedure: IV Infusion  IV Type: Peripheral, IV Location: R Antecubital  Venofer  (Iron  Sucrose), Dose: 300 mg  Infusion Start Time: 1109 am  Infusion Stop Time: 1309 pm  Post Infusion IV Care: Observation period completed and Peripheral IV Discontinued  Discharge: Condition: Good, Destination: Home . AVS Declined  Performed by:  Mayme Spearman, RN

## 2024-01-30 ENCOUNTER — Ambulatory Visit (INDEPENDENT_AMBULATORY_CARE_PROVIDER_SITE_OTHER)

## 2024-01-30 VITALS — BP 128/84 | HR 75 | Temp 98.8°F | Resp 20 | Ht 63.0 in | Wt 164.4 lb

## 2024-01-30 DIAGNOSIS — D649 Anemia, unspecified: Secondary | ICD-10-CM

## 2024-01-30 DIAGNOSIS — D509 Iron deficiency anemia, unspecified: Secondary | ICD-10-CM | POA: Diagnosis not present

## 2024-01-30 MED ORDER — SODIUM CHLORIDE 0.9 % IV SOLN
300.0000 mg | INTRAVENOUS | Status: DC
  Administered 2024-01-30: 300 mg via INTRAVENOUS
  Filled 2024-01-30: qty 15

## 2024-01-30 NOTE — Progress Notes (Signed)
 Diagnosis: Iron  Deficiency Anemia  Provider:  Praveen Mannam MD  Procedure: IV Infusion  IV Type: Peripheral, IV Location: R Forearm  Venofer  (Iron  Sucrose), Dose: 300 mg  Infusion Start Time: 1104  Infusion Stop Time: 1243  Post Infusion IV Care: Observation period completed and Peripheral IV Discontinued  Discharge: Condition: Good, Destination: Home . AVS Provided  Performed by:  Danene Montijo, RN

## 2024-01-30 NOTE — Patient Instructions (Signed)

## 2024-03-06 ENCOUNTER — Encounter (HOSPITAL_COMMUNITY): Payer: Self-pay | Admitting: Interventional Radiology

## 2024-03-23 ENCOUNTER — Ambulatory Visit

## 2024-03-23 VITALS — BP 139/94 | HR 93 | Ht 63.0 in | Wt 164.8 lb

## 2024-03-23 DIAGNOSIS — K2100 Gastro-esophageal reflux disease with esophagitis, without bleeding: Secondary | ICD-10-CM | POA: Diagnosis not present

## 2024-03-23 DIAGNOSIS — K122 Cellulitis and abscess of mouth: Secondary | ICD-10-CM

## 2024-03-23 DIAGNOSIS — M545 Low back pain, unspecified: Secondary | ICD-10-CM | POA: Diagnosis not present

## 2024-03-23 MED ORDER — DICLOFENAC SODIUM 75 MG PO TBEC
75.0000 mg | DELAYED_RELEASE_TABLET | Freq: Two times a day (BID) | ORAL | 0 refills | Status: DC
Start: 2024-03-23 — End: 2024-03-23

## 2024-03-23 MED ORDER — AMOXICILLIN-POT CLAVULANATE 875-125 MG PO TABS: 1.0000 | ORAL_TABLET | Freq: Two times a day (BID) | ORAL | 0 refills | Status: DC

## 2024-03-23 MED ORDER — PANTOPRAZOLE SODIUM 40 MG PO TBEC: 40.0000 mg | DELAYED_RELEASE_TABLET | Freq: Every day | ORAL | 3 refills | Status: DC

## 2024-03-23 MED ORDER — DICLOFENAC SODIUM 1 % EX GEL
2.0000 g | Freq: Four times a day (QID) | CUTANEOUS | 1 refills | Status: DC | PRN
Start: 2024-03-23 — End: 2024-03-24

## 2024-03-23 MED ORDER — LIDOCAINE 5 % EX PTCH
1.0000 | MEDICATED_PATCH | CUTANEOUS | 0 refills | Status: DC
Start: 2024-03-23 — End: 2024-03-24

## 2024-03-23 NOTE — Progress Notes (Cosign Needed)
    SUBJECTIVE:   CHIEF COMPLAINT / HPI:   Tooth Abscess 48 YO female present at [facility] today w/ c/o tooth abscess. Per patient she has been experiencing tooth decay and abscess which she was completed Augmentin  in May this year. Her symptom was significantly improved after she finished her antibiotic, however it recurs right after. She had a visit with her dentist for teeth extraction, however the procedure was denied by her dentist d/t the oral abscess. Pt denies fever, chills, or any s/s of infection. She states she has been having discharge from the right gum next to the cavity. ( Please see photos on media tab for more info)   Lower back pain  In addition, patient state she has been experiencing right lower back pain starting 2 days ago. Denies midline spinal pain and states her pain is more on the lower right of her back. Denies trauma. Pt states she was taking Ibuprofen in the past which was very help ease her back pain previously. However with history of anemia, Iron  deficiency , and Thrombocytopenia she no longer take NSAID for pain. Patient also denies bowel or bladder incontinence, no saddle paresthesias.      PERTINENT  PMH / PSH:  Tooth decay with right lower gum abscess Chronic anemia  Thrombocytopenia (HCC)  Syphilis  Iron  deficiency anemia due to chronic blood loss    OBJECTIVE:   BP (!) 139/94   Pulse 93   Ht 5' 3 (1.6 m)   Wt 164 lb 12.8 oz (74.8 kg)   SpO2 100%   BMI 29.19 kg/m   Physical Exam Constitutional:      Appearance: Normal appearance.  HENT:     Mouth/Throat:     Pharynx: Oropharyngeal exudate (right lower gum) present.  Cardiovascular:     Rate and Rhythm: Normal rate and regular rhythm.     Pulses: Normal pulses.     Heart sounds: Normal heart sounds.  Neurological:     Mental Status: She is alert.   MSK WNL 3+ bilateral UE and LE   ASSESSMENT/PLAN:   Assessment & Plan Cellulitis and abscess of oral soft tissues - CT w/ contrast order  was placed during this visit w/ pending result  - Start back on Augmentin  for 14 days Acute right-sided low back pain, unspecified whether sciatica present - Topical Voltaren  as needed - Lidocaine  patch Q24hrs as needed -  Physical Therapy Referal Gastroesophageal reflux disease with esophagitis, unspecified whether hemorrhage - Protonix  refilled per patient request   Plan discuss with Dr.Pray attending physician who agreed with plan.    Willies Laviolette, DO PGY1 Family Medicine Resident [facility]

## 2024-03-23 NOTE — Patient Instructions (Signed)
   It was great to see you!  Our plans for today:  - Please call [facility] radiology if you dont hear from them in next 2 days for CT schedule  Located in: [facility] Address: [address], [place], [place] [address] Phone: [phone]  - Apply Voltaren  to Right lower back or Lidocaine  patch as needed - You have referred to Physical Therapy to help increase your strength. They will reach out to you to schedule a visit - Please restart Augmentin  for 2 weeks. Please call if s/s become worsen or not improve - If you experience fever, palpitation, SOB, loss of consciousness please call 911 or visit ED   Take care and seek immediate care sooner if you develop any concerns.       Houston Coralee HAS PGY 1 Family Medicine Resident [facility]  [address] [place], [place] [address] Fax [phone] Phone [phone] 03/23/2024, 3:05 PM

## 2024-03-24 ENCOUNTER — Telehealth: Payer: Self-pay

## 2024-03-24 DIAGNOSIS — K2100 Gastro-esophageal reflux disease with esophagitis, without bleeding: Secondary | ICD-10-CM

## 2024-03-24 MED ORDER — PANTOPRAZOLE SODIUM 40 MG PO TBEC: 40.0000 mg | DELAYED_RELEASE_TABLET | Freq: Every day | ORAL | 3 refills | Status: AC

## 2024-03-24 MED ORDER — DICLOFENAC SODIUM 1 % EX GEL: 2.0000 g | Freq: Four times a day (QID) | CUTANEOUS | 1 refills | Status: DC | PRN

## 2024-03-24 MED ORDER — AMOXICILLIN-POT CLAVULANATE 875-125 MG PO TABS: 1.0000 | ORAL_TABLET | Freq: Two times a day (BID) | ORAL | 0 refills | Status: AC

## 2024-03-24 MED ORDER — LIDOCAINE 5 % EX PTCH: 1.0000 | MEDICATED_PATCH | CUTANEOUS | 0 refills | Status: DC

## 2024-03-24 NOTE — Telephone Encounter (Signed)
 Patient calls nurse line regarding issues with picking up prescriptions that were sent in yesterday.   Called pharmacy. 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   Forwarding prescriptions to AM preceptor.   Kiara JAYSON English, RN

## 2024-03-24 NOTE — Telephone Encounter (Signed)
Rx to pharmacy. [place] Flett, MD  Family Medicine Teaching Service   

## 2024-03-26 ENCOUNTER — Telehealth: Payer: Self-pay

## 2024-03-26 NOTE — Telephone Encounter (Signed)
 Pharmacy Patient Advocate Encounter   Received notification from CoverMyMeds that prior authorization for LIDOCAINE  5% PATCHES is required/requested.   Insurance verification completed.   The patient is insured through [facility] .   PA required; PA submitted to above mentioned insurance via CoverMyMeds Key/confirmation #/EOC [id number]. Status is pending

## 2024-03-26 NOTE — Telephone Encounter (Signed)
 Pharmacy Patient Advocate Encounter  Received notification from [facility] that Prior Authorization for LIDOCAINE  5% PATCHES has been DENIED.  Full denial letter will be uploaded to the media tab. See denial reason below.  [facility] reviewed your LIDOCAINE  5% PATCH request for the above-identified member, and it is denied for the following reason: because we did not see what we need to approve the drug you asked for, (lidocaine  patches). This request tells us  your doctor asked for this drug for your illness (lower back pain). We may be able to approve this drug for certain illnesses (postherpetic neuralgia, neuropathic pain, and chronic musculo-skeletal pain [greater than 6 months in duration]).   LIDOCAINE  4% PATCHES ARE AVAILABLE OTC  PA #/Case ID/Reference #: [id number]

## 2024-04-01 ENCOUNTER — Ambulatory Visit (HOSPITAL_BASED_OUTPATIENT_CLINIC_OR_DEPARTMENT_OTHER): Attending: Family Medicine

## 2024-04-01 ENCOUNTER — Encounter: Payer: Self-pay | Admitting: Family Medicine

## 2024-04-01 ENCOUNTER — Ambulatory Visit (INDEPENDENT_AMBULATORY_CARE_PROVIDER_SITE_OTHER): Admitting: Family Medicine

## 2024-04-01 VITALS — BP 125/86 | HR 88 | Ht 63.0 in | Wt 165.4 lb

## 2024-04-01 DIAGNOSIS — D649 Anemia, unspecified: Secondary | ICD-10-CM

## 2024-04-01 DIAGNOSIS — A539 Syphilis, unspecified: Secondary | ICD-10-CM | POA: Diagnosis not present

## 2024-04-01 DIAGNOSIS — M545 Low back pain, unspecified: Secondary | ICD-10-CM

## 2024-04-01 DIAGNOSIS — Z23 Encounter for immunization: Secondary | ICD-10-CM

## 2024-04-01 NOTE — Patient Instructions (Addendum)
 Thank you for visiting clinic today and allowing us  to participate in your care!  So glad to hear your back pain is improving! Please continue using tylenol  as needed, voltaren  gel as needed (up to 4 times a day), and lidocaine  patches as needed. If you dont hear from anyone in the next couple weeks about physical therapy, please let us  know.   We are checking your labs today and will contact you with the results when they are available.   Reach out any time with any questions or concerns you may have - we are here for you!  Damien Cassis, MD [facility] [phone]

## 2024-04-01 NOTE — Assessment & Plan Note (Signed)
 S/p 3 doses of penicillin .  - RPR today

## 2024-04-01 NOTE — Progress Notes (Signed)
    SUBJECTIVE:   CHIEF COMPLAINT / HPI:   Back pain  - seen in clinic recently for low back pain  - symptoms have been improving  - no pain radiation down legs, some pain of bilateral hips  - using tylenol  and voltaren  gel  - still awaiting PT  - works in [employer] and stands for several hours - has been trying to alternate sit/stand at work   Hx Syphilis  - Has completed 3 doses of penicillin  - Not yet completed post-treatment testing   PERTINENT  PMH / PSH: Low back pain, Hx Syphilis, Anemia, Thrombocytopenia  OBJECTIVE:   BP 125/86   Pulse 88   Ht 5' 3 (1.6 m)   Wt 165 lb 6.4 oz (75 kg)   SpO2 99%   BMI 29.30 kg/m   General: Well-appearing. Resting comfortably in room. CV: Normal S1/S2. No extra heart sounds. Warm and well-perfused. Pulm: Breathing comfortably on room air. CTAB. No increased WOB. MSK: No bony tenderness of spine. Tender to palpation of bilateral soft tissue of lower back.  Skin:  Warm, dry. Psych: Pleasant and appropriate.    ASSESSMENT/PLAN:   Assessment & Plan Low back pain, non-specific Reassuring exam today. Suspect soft tissue discomfort overuse.  - Cont tylenol , voltaren  gel as needed - Discussed lidocaine  patches as needed if interested  - Awaiting PT  - Discussed orthotic/supportive footwear while at work  Syphilis S/p 3 doses of penicillin .  - RPR today  Encounter for immunization Administered Tdap vaccine today.  Chronic anemia Follows with Hem/Onc and OBGYN for menorrhagia.  - Check CBC today  - Cont iron  supplement  - Has upcoming appt with Hem/Onc  RTC as needed.   Damien Cassis, MD [facility]

## 2024-04-01 NOTE — Assessment & Plan Note (Signed)
 Follows with Hem/Onc and OBGYN for menorrhagia.  - Check CBC today  - Cont iron  supplement  - Has upcoming appt with Hem/Onc

## 2024-04-02 ENCOUNTER — Inpatient Hospital Stay: Attending: Internal Medicine

## 2024-04-02 ENCOUNTER — Inpatient Hospital Stay (HOSPITAL_BASED_OUTPATIENT_CLINIC_OR_DEPARTMENT_OTHER): Admitting: Internal Medicine

## 2024-04-02 VITALS — BP 114/83 | HR 85 | Temp 97.3°F | Resp 16 | Ht 63.0 in | Wt 163.0 lb

## 2024-04-02 DIAGNOSIS — D649 Anemia, unspecified: Secondary | ICD-10-CM

## 2024-04-02 DIAGNOSIS — M2550 Pain in unspecified joint: Secondary | ICD-10-CM | POA: Diagnosis not present

## 2024-04-02 DIAGNOSIS — M549 Dorsalgia, unspecified: Secondary | ICD-10-CM | POA: Diagnosis not present

## 2024-04-02 DIAGNOSIS — N920 Excessive and frequent menstruation with regular cycle: Secondary | ICD-10-CM | POA: Diagnosis present

## 2024-04-02 DIAGNOSIS — D500 Iron deficiency anemia secondary to blood loss (chronic): Secondary | ICD-10-CM | POA: Insufficient documentation

## 2024-04-02 LAB — CBC WITH DIFFERENTIAL (CANCER CENTER ONLY)
Abs Immature Granulocytes: 0.02 K/uL (ref 0.00–0.07)
Basophils Absolute: 0 K/uL (ref 0.0–0.1)
Basophils Relative: 1 %
Eosinophils Absolute: 0.1 K/uL (ref 0.0–0.5)
Eosinophils Relative: 1 %
HCT: 37.7 % (ref 36.0–46.0)
Hemoglobin: 12.2 g/dL (ref 12.0–15.0)
Immature Granulocytes: 0 %
Lymphocytes Relative: 22 %
Lymphs Abs: 1.5 K/uL (ref 0.7–4.0)
MCH: 28 pg (ref 26.0–34.0)
MCHC: 32.4 g/dL (ref 30.0–36.0)
MCV: 86.5 fL (ref 80.0–100.0)
Monocytes Absolute: 0.3 K/uL (ref 0.1–1.0)
Monocytes Relative: 4 %
Neutro Abs: 4.7 K/uL (ref 1.7–7.7)
Neutrophils Relative %: 72 %
Platelet Count: 254 K/uL (ref 150–400)
RBC: 4.36 MIL/uL (ref 3.87–5.11)
RDW: 15.5 % (ref 11.5–15.5)
WBC Count: 6.6 K/uL (ref 4.0–10.5)
nRBC: 0 % (ref 0.0–0.2)

## 2024-04-02 LAB — IRON AND IRON BINDING CAPACITY (CC-WL,HP ONLY)
Iron: 86 ug/dL (ref 28–170)
Saturation Ratios: 25 % (ref 10.4–31.8)
TIBC: 343 ug/dL (ref 250–450)
UIBC: 257 ug/dL (ref 148–442)

## 2024-04-02 LAB — FERRITIN: Ferritin: 22 ng/mL (ref 11–307)

## 2024-04-02 NOTE — Progress Notes (Signed)
 [facility] [phone]   [phone]  OFFICE PROGRESS NOTE  Kiara Perkins, MD [address] [place] [address]  DIAGNOSIS:  1) iron  deficiency anemia secondary to menorrhagia, improved 2) thrombocytopenia, resolved  PRIOR THERAPY: PRBCs transfusion in addition to iron  infusion with Venofer  at her last infusion was in February 2025.   CURRENT THERAPY: None  INTERVAL HISTORY: Kiara [name] 48 y.o. female returns to the clinic today for hospital follow-up visit. Discussed the use of AI scribe software for clinical note transcription with the patient, who gave verbal consent to proceed.  History of Present Illness Kiara [name] is a 48 year old female with iron  deficiency anemia secondary to menorrhagia who presents for evaluation and repeat blood work.  She has a history of iron  deficiency anemia secondary to menorrhagia and has previously received PRBC transfusions and iron  infusions. She is not currently taking iron  supplements due to gastrointestinal discomfort but attempts to consume iron -rich foods such as broccoli and beets, although she has not been eating liver recently.  Her menstrual periods are variable, with some months being heavy and others not, and occasionally she goes two months without heavy bleeding. She is under the care of a gynecologist at [facility] for this issue.  She experiences back and joint pain, which she attributes to her work in a [employer] where she carries heavy items. She inquires about a medication she ran out of, which she believes was for appetite, but she is unsure of its origin. No recent weight loss.    MEDICAL HISTORY: Past Medical History:  Diagnosis Date   Anemia    Overweight 12/13/2021   Vitamin D deficiency 12/13/2021    ALLERGIES:  is allergic to shellfish allergy.  MEDICATIONS:  Current Outpatient Medications  Medication Sig Dispense Refill   amoxicillin -clavulanate  (AUGMENTIN ) 875-125 MG tablet Take 1 tablet by mouth 2 (two) times daily for 14 days. 28 tablet 0   diclofenac  Sodium (VOLTAREN ) 1 % GEL Apply 4 g topically 4 (four) times daily.     diclofenac  Sodium (VOLTAREN ) 1 % GEL Apply 2 g topically 4 (four) times daily as needed (pain). 150 g 1   ferrous sulfate  325 (65 FE) MG tablet Take 1 tablet (325 mg total) by mouth every other day. 100 tablet 0   lidocaine  (LIDODERM ) 5 % Place 1 patch onto the skin daily. Remove & Discard patch within 12 hours or as directed by MD 30 patch 0   megestrol  (MEGACE ) 40 MG tablet Take 1 tablet (40 mg total) by mouth daily. (Patient not taking: Reported on 03/23/2024) 60 tablet 0   pantoprazole  (PROTONIX ) 40 MG tablet Take 1 tablet (40 mg total) by mouth daily. 90 tablet 3   Current Facility-Administered Medications  Medication Dose Route Frequency Provider Last Rate Last Admin   diclofenac  Sodium (VOLTAREN ) 1 % topical gel 2 g  2 g Topical Daily PRN         SURGICAL HISTORY:  Past Surgical History:  Procedure Laterality Date   IR RADIOLOGIST EVAL & MGMT  01/20/2024    REVIEW OF SYSTEMS:  A comprehensive review of systems was negative except for: Musculoskeletal: positive for arthralgias and back pain   PHYSICAL EXAMINATION: General appearance: alert, cooperative, fatigued, and no distress Head: Normocephalic, without obvious abnormality, atraumatic Neck: no adenopathy, no JVD, supple, symmetrical, trachea midline, and thyroid not enlarged, symmetric, no tenderness/mass/nodules Lymph nodes: Cervical, supraclavicular, and axillary nodes normal. Resp:  clear to auscultation bilaterally Back: symmetric, no curvature. ROM normal. No CVA tenderness. Cardio: regular rate and rhythm, S1, S2 normal, no murmur, click, rub or gallop GI: soft, non-tender; bowel sounds normal; no masses,  no organomegaly Extremities: extremities normal, atraumatic, no cyanosis or edema  ECOG PERFORMANCE STATUS: 1 - Symptomatic but completely  ambulatory  Blood pressure 114/83, pulse 85, temperature (!) 97.3 F (36.3 C), temperature source Temporal, resp. rate 16, height 5' 3 (1.6 m), weight 163 lb (73.9 kg), SpO2 99%.  LABORATORY DATA: Lab Results  Component Value Date   WBC 6.6 04/02/2024   HGB 12.2 04/02/2024   HCT 37.7 04/02/2024   MCV 86.5 04/02/2024   PLT 254 04/02/2024      Chemistry      Component Value Date/Time   NA 136 12/18/2023 1931   K 3.4 (L) 12/18/2023 1931   CL 103 12/18/2023 1931   CO2 23 12/18/2023 1931   BUN 13 12/18/2023 1931   CREATININE 0.63 12/18/2023 1931   CREATININE 0.92 06/27/2023 1128      Component Value Date/Time   CALCIUM 9.3 12/18/2023 1931   ALKPHOS 69 12/18/2023 1931   AST 14 (L) 12/18/2023 1931   AST 13 (L) 06/27/2023 1128   ALT 12 12/18/2023 1931   ALT 12 06/27/2023 1128   BILITOT 0.5 12/18/2023 1931   BILITOT 0.4 06/27/2023 1128       RADIOGRAPHIC STUDIES: No results found.   ASSESSMENT AND PLAN: This is a very pleasant 48 years old African-American female with a history of iron  deficiency anemia secondary to menorrhagia status post PRBCs transfusion and iron  infusion during her hospitalization.  She also received iron  infusion with Venofer  in February 2025.   She is not currently on any oral iron  supplement because of upset stomach. Assessment and Plan Assessment & Plan Iron  deficiency anemia secondary to menorrhagia Previously treated with PRPCs transfusion and iron  infusion. Current hemoglobin is 12.2 and hematocrit is 7.7, indicating normal levels. Awaiting results of iron  studies to determine current iron  status. She is not taking iron  supplements due to gastrointestinal discomfort but is attempting to consume iron -rich foods. - Await results of iron  studies - If iron  levels are low, arrange for iron  infusion at [facility] - Follow up in three months regardless of iron  study results  Back and joint pain Likely related to occupational activities involving  heavy lifting and strain. She was advised to call immediately if she has any other concerning symptoms in the interval. The patient voices understanding of current disease status and treatment options and is in agreement with the current care plan.  All questions were answered. The patient knows to call the clinic with any problems, questions or concerns. We can certainly see the patient much sooner if necessary. The total time spent in the appointment was 20 minutes.  Disclaimer: This note was dictated with voice recognition software. Similar sounding words can inadvertently be transcribed and may not be corrected upon review.

## 2024-04-03 ENCOUNTER — Telehealth: Payer: Self-pay

## 2024-04-03 ENCOUNTER — Telehealth: Payer: Self-pay | Admitting: Family Medicine

## 2024-04-03 LAB — RPR: RPR Ser Ql: REACTIVE — AB

## 2024-04-03 LAB — CBC
Hematocrit: 41.8 % (ref 34.0–46.6)
Hemoglobin: 13.5 g/dL (ref 11.1–15.9)
MCH: 29 pg (ref 26.6–33.0)
MCHC: 32.3 g/dL (ref 31.5–35.7)
MCV: 90 fL (ref 79–97)
Platelets: 261 x10E3/uL (ref 150–450)
RBC: 4.66 x10E6/uL (ref 3.77–5.28)
RDW: 15.1 % (ref 11.7–15.4)
WBC: 6.6 x10E3/uL (ref 3.4–10.8)

## 2024-04-03 LAB — RPR, QUANT+TP ABS (REFLEX)
Rapid Plasma Reagin, Quant: 1:8 {titer} — ABNORMAL HIGH
T Pallidum Abs: REACTIVE — AB

## 2024-04-03 MED ORDER — LIDOCAINE 4 % EX PTCH: 1.0000 | MEDICATED_PATCH | CUTANEOUS | 1 refills | Status: DC

## 2024-04-03 NOTE — Telephone Encounter (Signed)
 Called and spoke with patient regarding recent nurse line call about continued back pain. Voltaren  gel unfortunately too expensive.  Discussed and sent for lidocaine  patches.  Discussed Tylenol  as needed.  Will send message to our referral team regarding previous physical therapy referral.  Also discussed recent lab results.  Discussed stable hemoglobin and low-normal ferritin.  Advised continued iron  supplementation every other day.  Discussed syphilis titers results.  Stable titers following completion of treatment in March.  Plan to recheck in a few months for decrease in titer.  Explained nature of testing after syphilis and treatment.   Questions addressed.  Patient expressed understanding.

## 2024-04-03 NOTE — Telephone Encounter (Signed)
 Entered in error. Please disregard

## 2024-04-03 NOTE — Telephone Encounter (Signed)
 Patient calls nurse line reporting continued back pain.   She reports she has been taking Tylenol  as told by PCP. She reports Voltaren  Gel is not covered by her insurance and she can not afford to purchase this over the counter.   She reports it is really hard for her to sit down or stay in one place for a long period if time. She reports she is not sleeping well.   She reports has not heard anything from PT. Per referral, this is still pending.   She denies any worsening symptoms. No incontinence.   Advised will forward to PCP for further recommendations for pain management.

## 2024-04-06 NOTE — Telephone Encounter (Signed)
 See other telephone note.

## 2024-05-04 ENCOUNTER — Other Ambulatory Visit: Payer: Self-pay

## 2024-05-04 ENCOUNTER — Ambulatory Visit: Attending: Family Medicine | Admitting: Physical Therapy

## 2024-05-04 ENCOUNTER — Encounter: Payer: Self-pay | Admitting: Physical Therapy

## 2024-05-04 DIAGNOSIS — M5459 Other low back pain: Secondary | ICD-10-CM | POA: Insufficient documentation

## 2024-05-04 DIAGNOSIS — M545 Low back pain, unspecified: Secondary | ICD-10-CM | POA: Diagnosis not present

## 2024-05-04 DIAGNOSIS — M6281 Muscle weakness (generalized): Secondary | ICD-10-CM | POA: Insufficient documentation

## 2024-05-04 DIAGNOSIS — M7099 Unspecified soft tissue disorder related to use, overuse and pressure multiple sites: Secondary | ICD-10-CM | POA: Insufficient documentation

## 2024-05-04 NOTE — Therapy (Signed)
 OUTPATIENT PHYSICAL THERAPY THORACOLUMBAR EVALUATION   Patient Name: Kiara [name] MRN: [id number] DOB:08-Jan-1976, 48 y.o., female Today's Date: 05/04/2024  END OF SESSION:  PT End of Session - 05/04/24 1355     Visit Number 1    Number of Visits 13    Date for PT Re-Evaluation 06/15/24    PT Start Time 1315    PT Stop Time 1355    PT Time Calculation (min) 40 min    Activity Tolerance Patient tolerated treatment well    Behavior During Therapy [facility] for tasks assessed/performed          Past Medical History:  Diagnosis Date   Anemia    Overweight 12/13/2021   Vitamin D deficiency 12/13/2021   Past Surgical History:  Procedure Laterality Date   IR RADIOLOGIST EVAL & MGMT  01/20/2024   Patient Active Problem List   Diagnosis Date Noted   Iron  deficiency anemia due to chronic blood loss 11/15/2023   Excessive bleeding in premenopausal period 07/31/2023   Syphilis 07/08/2023   Thrombocytopenia (HCC) 05/22/2023   Chronic anemia 12/15/2021    PCP: Diona Perkins, MD  REFERRING PROVIDER: Donah Laymon PARAS, MD  REFERRING DIAG: M54.50 (ICD-10-CM) - Acute right-sided low back pain, unspecified whether sciatica present   Rationale for Evaluation and Treatment: Rehabilitation  THERAPY DIAG:  Other low back pain  Muscle weakness (generalized)  Unspecified soft tissue disorder related to use, overuse and pressure multiple sites  PERTINENT HISTORY: Anemia,   WEIGHT BEARING RESTRICTIONS: No  FALLS:  Has patient fallen in last 6 months? No  LIVING ENVIRONMENT: Lives with: lives with their family Lives in: House/apartment Stairs: No Has following equipment at home: None  OCCUPATION: Youth worker at [employer]   PRECAUTIONS: None ---------------------------------------------------------------------------------------------  SUBJECTIVE:                                                                                                                                                                                            SUBJECTIVE STATEMENT: Eval statement 05/04/2024: Low back pain, occasionally refers down the R posterior knee. Hurts especially with lumbar rotation, difficulties with hip flexion. 5/10 pain currently, no n/t, no swelling.  Could only stand for 15 minutes initially, has improved a bit since beginning in June 2025.  RED FLAGS: None    PLOF: Independent  PATIENT GOALS: Get rid of pain, be able to stand longer  NEXT MD VISIT: next month ---------------------------------------------------------------------------------------------  OBJECTIVE:  Note: Objective measures were completed at Evaluation unless otherwise noted.  DIAGNOSTIC FINDINGS:  No recent pertinent imaging  PATIENT SURVEYS:  MODI: 26/50 (52%)  COGNITION: Overall cognitive status:  Within functional limits for tasks assessed   PALPATION: Tenderness to R erector spinae, bicep femoris and semitendinosus.  Lumbar contraction pattern  L Multifidus: good quality, strong contraction  R Multifidus: God quality, strong contraction   SENSATION: WFL  MUSCLE LENGTH: Hamstrings: Right 15; deg; Left 15 deg  POSTURE: No Significant postural limitations   LUMBAR ROM:   AROM eval  Flexion 70%  Extension 100%  Right lateral flexion 80%  Left lateral flexion 80%  Right rotation 80%  Left rotation 60%   (Blank rows = not tested)  ! Indicates pain with testing  LOWER EXTREMITY ROM:     Passive  Right eval Left eval  Hip flexion [facility]  Hip extension [facility]  Hip abduction    Hip adduction    Hip internal rotation    Hip external rotation    Knee flexion    Knee extension    Ankle dorsiflexion    Ankle plantarflexion    Ankle inversion    Ankle eversion     (Blank rows = not tested)  ! Indicates pain with testing  LOWER EXTREMITY MMT:    MMT Right eval Left eval  Hip flexion 4- 4-  Hip extension 4 4  Hip abduction 4- 4-  Hip adduction     Hip internal rotation    Hip external rotation    Knee flexion 4 4-  Knee extension 5 5  Ankle dorsiflexion    Ankle plantarflexion    Ankle inversion    Ankle eversion     (Blank rows = not tested)   ! Indicates pain with testing LUMBAR SPECIAL TESTS:  Prone instability test: Negative and Straight leg raise test: false positive  GAIT: Distance walked: 151ft Assistive device utilized: None Level of assistance: Complete Independence Comments:  [facility] Adult PT Treatment:                                                DATE: 05/04/2024 Self Care: POC discussion Pt education                                                                                                                                PATIENT EDUCATION:  Education details: Pt received education regarding HEP performance, ADL performance, functional activity tolerance, impairment education, appropriate performance of therapeutic activities.  Person educated: Patient Education method: Explanation, Demonstration, Tactile cues, Verbal cues, and Handouts Education comprehension: verbalized understanding and returned demonstration  HOME EXERCISE PROGRAM: Access Code: [email or web address] URL: [email or web address] Date: 05/04/2024 Prepared by: Mabel Kiang  Exercises - Standing Hip Flexor Stretch  - 1-2 x daily - 7 x weekly - 2 sets - 1 reps - 74m hold - Seated Hamstring Stretch  - 1 x daily - 7 x weekly - 2 sets - 1 reps -  22m hold - Standing Hamstring Curl with Resistance  - 1 x daily - 4 x weekly - 2 sets - 12 reps - 2s hold - Hooklying Single Leg Bent Knee Fallouts with Resistance  - 1 x daily - 4 x weekly - 2-3 sets - 12 reps - 2s hold - Bridge with Resistance  - 1 x daily - 4 x weekly - 2-3 sets - 12 reps - 5s hold ---------------------------------------------------------------------------------------------  ASSESSMENT:  CLINICAL IMPRESSION: Eval impression (05/04/2024): Pt. attended today's physical therapy  session for evaluation of LBP. Pt has complaints of LBP that refers down the R posterior thigh Pain prevents pt from standing for longer than 15 minutes, and gets worse with lumbar rotation. No neurological s/s. Pt has notable deficits and would benefit from therapeutic focus on proximal hip strengthening with emphasis on hamstrings, 2 joint hip flexor motility, and lumbar motility/ stabilization. Treatment performed today focused on pt education detailed in the objective. Pt demonstrated great understanding of education provided. required minimal v/t cues and no assistance for appropriate performance with today's activities. Pt requires the intervention of skilled outpatient physical therapy to address the aforementioned deficits and progress towards a functional level in line with therapeutic goals.    OBJECTIVE IMPAIRMENTS: Abnormal gait, decreased ROM, decreased strength, increased fascial restrictions, impaired flexibility, improper body mechanics, postural dysfunction, and pain.   ACTIVITY LIMITATIONS: carrying, standing, and locomotion level  PARTICIPATION LIMITATIONS: meal prep, cleaning, driving, community activity, and occupation  PERSONAL FACTORS: Fitness, Profession, and Time since onset of injury/illness/exacerbation are also affecting patient's functional outcome.   REHAB POTENTIAL: Good  CLINICAL DECISION MAKING: Stable/uncomplicated  EVALUATION COMPLEXITY: Low   GOALS: Goals reviewed with patient? YES  SHORT TERM GOALS: Target date: 05/25/2024  Pt will be independent with administered HEP to demonstrate the competency necessary for long term managemnet of symptoms at home. Baseline: Goal status: INITIAL   LONG TERM GOALS: Target date: 06/15/2024  Pt. Will achieve a MODI score of 20/50 (40%) as to demonstrate improvement in self-perceived functional ability with daily activities.  Baseline: 26/60 (52%) Goal status: INITIAL  2.  Pt will improve Global hip strength to a  4+/5 to demonstrate improvement in strength for quality of motion and activity performance.  Baseline:  Goal status: INITIAL  3.  Pt will improve Lumbar AROM to 90% of standardized norms with less than 2/10 pain to demonstrate necessary mobility for high quality and safe ADLs  Baseline:  Goal status: INITIAL  4.  Pt will report the ability to stand for >/= 60 minutes as to demonstrate improved tolerance to standing for prolonged time and improved ability to participate in occupational tasks. Baseline: 15 minutes Goal status: INITIAL  5.  Pt will report pain levels improving during ADLs to be less than or equal to 2/10 as to demonstrate improved tolerance with daily functional activities such as cleaning and driving. Baseline: 5/10 Goal status: INITIAL  ---------------------------------------------------------------------------------------------  PLAN:  PT FREQUENCY: 1-2x/week  PT DURATION: 6 weeks  PLANNED INTERVENTIONS: 97110-Therapeutic exercises, 97530- Therapeutic activity, [id number]- Neuromuscular re-education, 97535- Self Care, [id number]- Manual therapy, [phone]- Gait training, [phone]- Aquatic Therapy, Patient/Family education, Balance training, Stair training, Joint mobilization, Spinal mobilization, and Moist heat.  PLAN FOR NEXT SESSION: Review HEP, Begin POC as detailed in assessment   Mabel Kiang, PT, DPT 05/04/2024, 1:55 PM    For all possible CPT codes, reference the Planned Interventions line above.     Check all conditions that are expected to impact treatment: {Conditions expected  to impact treatment:None of these apply   If treatment provided at initial evaluation, no treatment charged due to lack of authorization.

## 2024-05-18 ENCOUNTER — Ambulatory Visit: Admitting: Physical Therapy

## 2024-05-23 ENCOUNTER — Ambulatory Visit

## 2024-05-23 NOTE — Therapy (Incomplete)
 OUTPATIENT PHYSICAL THERAPY TREATMENT NOTE   Patient Name: Kiara [name] MRN: [id number] DOB:Mar 25, 1976, 48 y.o., female Today's Date: 05/23/2024  END OF SESSION:    Past Medical History:  Diagnosis Date   Anemia    Overweight 12/13/2021   Vitamin D deficiency 12/13/2021   Past Surgical History:  Procedure Laterality Date   IR RADIOLOGIST EVAL & MGMT  01/20/2024   Patient Active Problem List   Diagnosis Date Noted   Iron  deficiency anemia due to chronic blood loss 11/15/2023   Excessive bleeding in premenopausal period 07/31/2023   Syphilis 07/08/2023   Thrombocytopenia (HCC) 05/22/2023   Chronic anemia 12/15/2021    PCP: Diona Perkins, MD  REFERRING PROVIDER: Donah Laymon PARAS, MD  REFERRING DIAG: M54.50 (ICD-10-CM) - Acute right-sided low back pain, unspecified whether sciatica present   Rationale for Evaluation and Treatment: Rehabilitation  THERAPY DIAG:  No diagnosis found.  PERTINENT HISTORY: Anemia,   WEIGHT BEARING RESTRICTIONS: No  FALLS:  Has patient fallen in last 6 months? No  LIVING ENVIRONMENT: Lives with: lives with their family Lives in: House/apartment Stairs: No Has following equipment at home: None  OCCUPATION: Youth worker at [employer]   PRECAUTIONS: None ---------------------------------------------------------------------------------------------  SUBJECTIVE:                                                                                                                                                                                           SUBJECTIVE STATEMENT: ***  Eval statement 05/04/2024: Low back pain, occasionally refers down the R posterior knee. Hurts especially with lumbar rotation, difficulties with hip flexion. 5/10 pain currently, no n/t, no swelling.  Could only stand for 15 minutes initially, has improved a bit since beginning in June 2025.  RED FLAGS: None    PLOF: Independent  PATIENT GOALS: Get rid  of pain, be able to stand longer  NEXT MD VISIT: next month ---------------------------------------------------------------------------------------------  OBJECTIVE:  Note: Objective measures were completed at Evaluation unless otherwise noted.  DIAGNOSTIC FINDINGS:  No recent pertinent imaging  PATIENT SURVEYS:  MODI: 26/50 (52%)  COGNITION: Overall cognitive status: Within functional limits for tasks assessed   PALPATION: Tenderness to R erector spinae, bicep femoris and semitendinosus.  Lumbar contraction pattern  L Multifidus: good quality, strong contraction  R Multifidus: God quality, strong contraction   SENSATION: WFL  MUSCLE LENGTH: Hamstrings: Right 15; deg; Left 15 deg  POSTURE: No Significant postural limitations   LUMBAR ROM:   AROM eval  Flexion 70%  Extension 100%  Right lateral flexion 80%  Left lateral flexion 80%  Right rotation 80%  Left rotation 60%   (Blank rows = not  tested)  ! Indicates pain with testing  LOWER EXTREMITY ROM:     Passive  Right eval Left eval  Hip flexion [facility]  Hip extension [facility]  Hip abduction    Hip adduction    Hip internal rotation    Hip external rotation    Knee flexion    Knee extension    Ankle dorsiflexion    Ankle plantarflexion    Ankle inversion    Ankle eversion     (Blank rows = not tested)  ! Indicates pain with testing  LOWER EXTREMITY MMT:    MMT Right eval Left eval  Hip flexion 4- 4-  Hip extension 4 4  Hip abduction 4- 4-  Hip adduction    Hip internal rotation    Hip external rotation    Knee flexion 4 4-  Knee extension 5 5  Ankle dorsiflexion    Ankle plantarflexion    Ankle inversion    Ankle eversion     (Blank rows = not tested)   ! Indicates pain with testing LUMBAR SPECIAL TESTS:  Prone instability test: Negative and Straight leg raise test: false positive  GAIT: Distance walked: 171ft Assistive device utilized: None Level of assistance: Complete  Independence Comments:  [facility] Adult PT Treatment:                                                DATE: 05/23/24 Therapeutic Exercise: Nustep level 5 x 8 mins while gathering subjective and planning session with patient Seated hamstring stretch 2x30 BIL Seated pball roll outs fwd/lat x10 ea Modified thomas stretch EOM x2' RLE Supine LTR x10 BIL Sidelying open books x10 BIL Neuromuscular re-ed: Supine SLR with contralateral pilates ring push x10 BIL PPT 3 hold x15 Therapeutic Activity: Standing 3 way hip YTB at ankles x10 BIL   [facility] Adult PT Treatment:                                                DATE: 05/04/2024 Self Care: POC discussion Pt education                                                                                                                                PATIENT EDUCATION:  Education details: Pt received education regarding HEP performance, ADL performance, functional activity tolerance, impairment education, appropriate performance of therapeutic activities.  Person educated: Patient Education method: Explanation, Demonstration, Tactile cues, Verbal cues, and Handouts Education comprehension: verbalized understanding and returned demonstration  HOME EXERCISE PROGRAM: Access Code: [email or web address] URL: [email or web address] Date: 05/04/2024 Prepared by: Mabel Kiang  Exercises - Standing Hip Flexor Stretch  - 1-2 x daily - 7 x  weekly - 2 sets - 1 reps - 66m hold - Seated Hamstring Stretch  - 1 x daily - 7 x weekly - 2 sets - 1 reps - 66m hold - Standing Hamstring Curl with Resistance  - 1 x daily - 4 x weekly - 2 sets - 12 reps - 2s hold - Hooklying Single Leg Bent Knee Fallouts with Resistance  - 1 x daily - 4 x weekly - 2-3 sets - 12 reps - 2s hold - Bridge with Resistance  - 1 x daily - 4 x weekly - 2-3 sets - 12 reps - 5s  hold ---------------------------------------------------------------------------------------------  ASSESSMENT:  CLINICAL IMPRESSION: ***  Eval impression (05/04/2024): Pt. attended today's physical therapy session for evaluation of LBP. Pt has complaints of LBP that refers down the R posterior thigh Pain prevents pt from standing for longer than 15 minutes, and gets worse with lumbar rotation. No neurological s/s. Pt has notable deficits and would benefit from therapeutic focus on proximal hip strengthening with emphasis on hamstrings, 2 joint hip flexor motility, and lumbar motility/ stabilization. Treatment performed today focused on pt education detailed in the objective. Pt demonstrated great understanding of education provided. required minimal v/t cues and no assistance for appropriate performance with today's activities. Pt requires the intervention of skilled outpatient physical therapy to address the aforementioned deficits and progress towards a functional level in line with therapeutic goals.    OBJECTIVE IMPAIRMENTS: Abnormal gait, decreased ROM, decreased strength, increased fascial restrictions, impaired flexibility, improper body mechanics, postural dysfunction, and pain.   ACTIVITY LIMITATIONS: carrying, standing, and locomotion level  PARTICIPATION LIMITATIONS: meal prep, cleaning, driving, community activity, and occupation  PERSONAL FACTORS: Fitness, Profession, and Time since onset of injury/illness/exacerbation are also affecting patient's functional outcome.   REHAB POTENTIAL: Good  CLINICAL DECISION MAKING: Stable/uncomplicated  EVALUATION COMPLEXITY: Low   GOALS: Goals reviewed with patient? YES  SHORT TERM GOALS: Target date: 05/25/2024  Pt will be independent with administered HEP to demonstrate the competency necessary for long term managemnet of symptoms at home. Baseline: Goal status: INITIAL   LONG TERM GOALS: Target date: 06/15/2024  Pt. Will  achieve a MODI score of 20/50 (40%) as to demonstrate improvement in self-perceived functional ability with daily activities.  Baseline: 26/60 (52%) Goal status: INITIAL  2.  Pt will improve Global hip strength to a 4+/5 to demonstrate improvement in strength for quality of motion and activity performance.  Baseline:  Goal status: INITIAL  3.  Pt will improve Lumbar AROM to 90% of standardized norms with less than 2/10 pain to demonstrate necessary mobility for high quality and safe ADLs  Baseline:  Goal status: INITIAL  4.  Pt will report the ability to stand for >/= 60 minutes as to demonstrate improved tolerance to standing for prolonged time and improved ability to participate in occupational tasks. Baseline: 15 minutes Goal status: INITIAL  5.  Pt will report pain levels improving during ADLs to be less than or equal to 2/10 as to demonstrate improved tolerance with daily functional activities such as cleaning and driving. Baseline: 5/10 Goal status: INITIAL  ---------------------------------------------------------------------------------------------  PLAN:  PT FREQUENCY: 1-2x/week  PT DURATION: 6 weeks  PLANNED INTERVENTIONS: 97110-Therapeutic exercises, 97530- Therapeutic activity, [id number]- Neuromuscular re-education, 97535- Self Care, [id number]- Manual therapy, [phone]- Gait training, [phone]- Aquatic Therapy, Patient/Family education, Balance training, Stair training, Joint mobilization, Spinal mobilization, and Moist heat.  PLAN FOR NEXT SESSION: Review HEP, Begin POC as detailed in assessment   Corean Pouch PTA  05/23/2024, 8:18 AM    For all possible CPT codes, reference the Planned Interventions line above.     Check all conditions that are expected to impact treatment: {Conditions expected to impact treatment:None of these apply   If treatment provided at initial evaluation, no treatment charged due to lack of authorization.

## 2024-07-01 ENCOUNTER — Inpatient Hospital Stay: Attending: Internal Medicine

## 2024-07-01 ENCOUNTER — Inpatient Hospital Stay: Admitting: Internal Medicine

## 2024-07-01 VITALS — BP 127/87 | HR 108 | Temp 97.7°F | Resp 17 | Ht 63.0 in | Wt 162.0 lb

## 2024-07-01 DIAGNOSIS — D649 Anemia, unspecified: Secondary | ICD-10-CM

## 2024-07-01 DIAGNOSIS — D500 Iron deficiency anemia secondary to blood loss (chronic): Secondary | ICD-10-CM | POA: Insufficient documentation

## 2024-07-01 DIAGNOSIS — N920 Excessive and frequent menstruation with regular cycle: Secondary | ICD-10-CM | POA: Insufficient documentation

## 2024-07-01 LAB — CBC WITH DIFFERENTIAL (CANCER CENTER ONLY)
Abs Immature Granulocytes: 0.01 K/uL (ref 0.00–0.07)
Basophils Absolute: 0.1 K/uL (ref 0.0–0.1)
Basophils Relative: 1 %
Eosinophils Absolute: 0.1 K/uL (ref 0.0–0.5)
Eosinophils Relative: 2 %
HCT: 28.5 % — ABNORMAL LOW (ref 36.0–46.0)
Hemoglobin: 8.4 g/dL — ABNORMAL LOW (ref 12.0–15.0)
Immature Granulocytes: 0 %
Lymphocytes Relative: 23 %
Lymphs Abs: 1.6 K/uL (ref 0.7–4.0)
MCH: 22 pg — ABNORMAL LOW (ref 26.0–34.0)
MCHC: 29.5 g/dL — ABNORMAL LOW (ref 30.0–36.0)
MCV: 74.6 fL — ABNORMAL LOW (ref 80.0–100.0)
Monocytes Absolute: 0.4 K/uL (ref 0.1–1.0)
Monocytes Relative: 6 %
Neutro Abs: 4.7 K/uL (ref 1.7–7.7)
Neutrophils Relative %: 68 %
Platelet Count: 514 K/uL — ABNORMAL HIGH (ref 150–400)
RBC: 3.82 MIL/uL — ABNORMAL LOW (ref 3.87–5.11)
RDW: 16.4 % — ABNORMAL HIGH (ref 11.5–15.5)
WBC Count: 6.8 K/uL (ref 4.0–10.5)
nRBC: 0 % (ref 0.0–0.2)

## 2024-07-01 LAB — IRON AND IRON BINDING CAPACITY (CC-WL,HP ONLY)
Iron: 13 ug/dL — ABNORMAL LOW (ref 28–170)
Saturation Ratios: 3 % — ABNORMAL LOW (ref 10.4–31.8)
TIBC: 434 ug/dL (ref 250–450)
UIBC: 421 ug/dL (ref 148–442)

## 2024-07-01 LAB — FERRITIN: Ferritin: 4 ng/mL — ABNORMAL LOW (ref 11–307)

## 2024-07-01 NOTE — Progress Notes (Signed)
 [facility] [phone]   [phone]  OFFICE PROGRESS NOTE  Diona Perkins, MD [address] [place] [address]  DIAGNOSIS:  1) iron  deficiency anemia secondary to menorrhagia, improved 2) thrombocytopenia, resolved  PRIOR THERAPY: PRBCs transfusion in addition to iron  infusion with Venofer  at her last infusion was in February 2025.   CURRENT THERAPY: Iron  infusion with Venofer  300 mg IV weekly for 3 weeks first dose July 03, 2024  INTERVAL HISTORY: Kiara [name] 47 y.o. female returns to the clinic today for hospital follow-up visit. Discussed the use of AI scribe software for clinical note transcription with the patient, who gave verbal consent to proceed.  History of Present Illness Kiara [name] is a 48 year old female with iron  deficiency anemia secondary to menorrhagia who presents for evaluation and repeat blood work.  She has a history of iron  deficiency anemia secondary to menorrhagia and has previously received iron  infusions. Her menstrual cycle recently ended, starting slow but becoming heavy by the third day.  She is not currently taking any supplements, with her last treatment being an iron  infusion. Her most recent hemoglobin was 8.4, and three months ago it was 12.2.  No chest pain, breathing issues, nausea, vomiting, diarrhea, or blood in her stool or urine. She mentions experiencing a 'little headache' and intermittent stomach discomfort over the past week.    MEDICAL HISTORY: Past Medical History:  Diagnosis Date   Anemia    Overweight 12/13/2021   Vitamin D deficiency 12/13/2021    ALLERGIES:  is allergic to shellfish allergy.  MEDICATIONS:  Current Outpatient Medications  Medication Sig Dispense Refill   diclofenac  Sodium (VOLTAREN ) 1 % GEL Apply 4 g topically 4 (four) times daily.     diclofenac  Sodium (VOLTAREN ) 1 % GEL Apply 2 g topically 4 (four) times daily as needed (pain). 150 g 1   ferrous  sulfate 325 (65 FE) MG tablet Take 1 tablet (325 mg total) by mouth every other day. 100 tablet 0   lidocaine  4 % Place 1 patch onto the skin daily. 15 patch 1   megestrol  (MEGACE ) 40 MG tablet Take 1 tablet (40 mg total) by mouth daily. 60 tablet 0   pantoprazole  (PROTONIX ) 40 MG tablet Take 1 tablet (40 mg total) by mouth daily. 90 tablet 3   Current Facility-Administered Medications  Medication Dose Route Frequency Provider Last Rate Last Admin   diclofenac  Sodium (VOLTAREN ) 1 % topical gel 2 g  2 g Topical Daily PRN         SURGICAL HISTORY:  Past Surgical History:  Procedure Laterality Date   IR RADIOLOGIST EVAL & MGMT  01/20/2024    REVIEW OF SYSTEMS:  A comprehensive review of systems was negative except for: Constitutional: positive for fatigue   PHYSICAL EXAMINATION: General appearance: alert, cooperative, fatigued, and no distress Head: Normocephalic, without obvious abnormality, atraumatic Neck: no adenopathy, no JVD, supple, symmetrical, trachea midline, and thyroid not enlarged, symmetric, no tenderness/mass/nodules Lymph nodes: Cervical, supraclavicular, and axillary nodes normal. Resp: clear to auscultation bilaterally Back: symmetric, no curvature. ROM normal. No CVA tenderness. Cardio: regular rate and rhythm, S1, S2 normal, no murmur, click, rub or gallop GI: soft, non-tender; bowel sounds normal; no masses,  no organomegaly Extremities: extremities normal, atraumatic, no cyanosis or edema  ECOG PERFORMANCE STATUS: 1 - Symptomatic but completely ambulatory  Blood pressure 127/87, pulse (!) 108, temperature 97.7 F (36.5 C), resp. rate 17, height 5' 3 (  1.6 m), weight 162 lb (73.5 kg), SpO2 100%.  LABORATORY DATA: Lab Results  Component Value Date   WBC 6.8 07/01/2024   HGB 8.4 (L) 07/01/2024   HCT 28.5 (L) 07/01/2024   MCV 74.6 (L) 07/01/2024   PLT 514 (H) 07/01/2024      Chemistry      Component Value Date/Time   NA 136 12/18/2023 1931   K 3.4 (L)  12/18/2023 1931   CL 103 12/18/2023 1931   CO2 23 12/18/2023 1931   BUN 13 12/18/2023 1931   CREATININE 0.63 12/18/2023 1931   CREATININE 0.92 06/27/2023 1128      Component Value Date/Time   CALCIUM 9.3 12/18/2023 1931   ALKPHOS 69 12/18/2023 1931   AST 14 (L) 12/18/2023 1931   AST 13 (L) 06/27/2023 1128   ALT 12 12/18/2023 1931   ALT 12 06/27/2023 1128   BILITOT 0.5 12/18/2023 1931   BILITOT 0.4 06/27/2023 1128       RADIOGRAPHIC STUDIES: No results found.   ASSESSMENT AND PLAN: This is a very pleasant 48 years old African-American female with a history of iron  deficiency anemia secondary to menorrhagia status post PRBCs transfusion and iron  infusion during her hospitalization.  She also received iron  infusion with Venofer  in February 2025.   She is not currently on any oral iron  supplement because of upset stomach. Repeat CBC today showed significant anemia with hemoglobin of 8.4. Assessment and Plan Assessment & Plan Iron  deficiency anemia secondary to menorrhagia Iron  deficiency anemia with a significant drop in hemoglobin from 12.2 to 8.4 over three months, likely due to heavy menstrual bleeding. No report of gastrointestinal bleeding or other sources of blood loss. - Arrange for iron  infusion at [facility], starting this Friday. - Administer iron  infusion once a week for the next three weeks. - Schedule follow-up appointment in three months. - Instruct her to report any increased fatigue, bleeding, or other concerning symptoms. The patient was advised to call immediately if she has any concerning symptoms in the interval.  The patient voices understanding of current disease status and treatment options and is in agreement with the current care plan.  All questions were answered. The patient knows to call the clinic with any problems, questions or concerns. We can certainly see the patient much sooner if necessary. The total time spent in the appointment was 20  minutes.  Disclaimer: This note was dictated with voice recognition software. Similar sounding words can inadvertently be transcribed and may not be corrected upon review.

## 2024-07-15 ENCOUNTER — Telehealth: Payer: Self-pay

## 2024-07-15 NOTE — Telephone Encounter (Signed)
 Received call from pt regarding no infusion orders for iron . Called [facility] who notified that referral was incorrect on the to portion. They were able to fix the referral where they could see the request and get pt scheduled. Pt aware that she will receive scheduling call from their infusion center.

## 2024-07-16 ENCOUNTER — Telehealth: Payer: Self-pay | Admitting: Pharmacy Technician

## 2024-07-16 NOTE — Telephone Encounter (Signed)
 Auth Submission: NO AUTH NEEDED Site of care: Site of care: [facility] Payer: HE[employer] BLUE Medication & CPT/J Code(s) submitted: Venofer  (Iron  Sucrose) J1756 Diagnosis Code:  Route of submission (phone, fax, portal):  Phone # Fax # Auth type: Buy/Bill PB Units/visits requested: 3 DOSES Reference number:  Approval from: 07/16/24 to 09/09/24

## 2024-07-20 ENCOUNTER — Telehealth: Payer: Self-pay | Admitting: *Deleted

## 2024-07-20 NOTE — Telephone Encounter (Signed)
 Pt. called and states [facility] has not called for Iron  infusion appt. I spoke with Leita PEAK at [facility] and she states referral came in 07/16/24 and she will call pt. today to set up an appointment.

## 2024-07-24 ENCOUNTER — Ambulatory Visit (INDEPENDENT_AMBULATORY_CARE_PROVIDER_SITE_OTHER)

## 2024-07-24 VITALS — BP 137/88 | HR 99 | Temp 98.5°F | Resp 16 | Ht 63.0 in | Wt 165.4 lb

## 2024-07-24 DIAGNOSIS — D500 Iron deficiency anemia secondary to blood loss (chronic): Secondary | ICD-10-CM

## 2024-07-24 MED ORDER — IRON SUCROSE 300 MG IVPB - SIMPLE MED
300.0000 mg | Status: DC
  Administered 2024-07-24: 300 mg via INTRAVENOUS
  Filled 2024-07-24: qty 265

## 2024-07-24 NOTE — Progress Notes (Signed)
 Diagnosis: Iron  Deficiency Anemia  Provider:  Mannam, Praveen MD  Procedure: IV Infusion  IV Type: Peripheral, IV Location: R Antecubital  Venofer  (Iron  Sucrose), Dose: 300 mg  Infusion Start Time: 1350 pm  Infusion Stop Time: 1530 pm  Post Infusion IV Care: Observation period completed and Peripheral IV Discontinued  Discharge: Condition: Good, Destination: Home . AVS Declined  Performed by:  Rocky FORBES Sar, RN

## 2024-07-30 ENCOUNTER — Ambulatory Visit (INDEPENDENT_AMBULATORY_CARE_PROVIDER_SITE_OTHER)

## 2024-07-30 VITALS — BP 106/72 | HR 92 | Temp 98.1°F | Resp 16 | Ht 63.0 in | Wt 163.6 lb

## 2024-07-30 DIAGNOSIS — D500 Iron deficiency anemia secondary to blood loss (chronic): Secondary | ICD-10-CM

## 2024-07-30 MED ORDER — IRON SUCROSE 300 MG IVPB - SIMPLE MED
300.0000 mg | Status: DC
  Administered 2024-07-30: 300 mg via INTRAVENOUS
  Filled 2024-07-30: qty 265

## 2024-07-30 NOTE — Progress Notes (Signed)
 Diagnosis: Acute Anemia  Provider:  Lonna Coder MD  Procedure: IV Infusion  IV Type: Peripheral, IV Location: R Forearm  Venofer  (Iron  Sucrose), Dose: 300 mg  Infusion Start Time: 1228  Infusion Stop Time: 1400  Post Infusion IV Care: Observation period completed and Peripheral IV Discontinued  Discharge: Condition: Good, Destination: Home . AVS Provided  Performed by:  Donny Childes, RN

## 2024-08-10 ENCOUNTER — Ambulatory Visit

## 2024-08-10 VITALS — BP 132/88 | HR 90 | Temp 98.2°F | Resp 18 | Ht 63.0 in | Wt 164.0 lb

## 2024-08-10 DIAGNOSIS — D500 Iron deficiency anemia secondary to blood loss (chronic): Secondary | ICD-10-CM

## 2024-08-10 MED ORDER — IRON SUCROSE 300 MG IVPB - SIMPLE MED
300.0000 mg | Status: DC
  Administered 2024-08-10: 300 mg via INTRAVENOUS
  Filled 2024-08-10: qty 265

## 2024-08-10 NOTE — Patient Instructions (Signed)
  [facility] [address] [place], [place] [address] [phone]   08/10/2024  Patient: Kiara [name]  Date of Birth: Apr 07, 1976 Date of Visit: Date: 08/10/2024   To whom it may concern:  Kiara [name] was seen and treated on Date: 08/10/2024. If you have any questions or concerns, please don't hesitate to call our office at [phone].    Sincerely,     Berneda Beery, RN

## 2024-08-10 NOTE — Progress Notes (Signed)
 Diagnosis: , Iron  Deficiency Anemia  Provider:  Mannam, Praveen MD  Procedure: IV Infusion  IV Type: Peripheral, IV Location: R Forearm  , Venofer  (Iron  Sucrose), Dose: 200 mg  Infusion Start Time: 1339  Infusion Stop Time: 1522  Post Infusion IV Care: Observation period completed and Peripheral IV Discontinued  Discharge: Condition: Good, Destination: Home . AVS Provided  Performed by:  Camran Keady, RN

## 2024-08-22 NOTE — Progress Notes (Unsigned)
° ° °  SUBJECTIVE:   Chief compliant/HPI: annual examination  Kiara [name] is a 49 y.o. who presents today for an annual exam.   History tabs reviewed and updated ***.   Review of systems form reviewed and notable for ***.   OBJECTIVE:   There were no vitals taken for this visit.  ***  ASSESSMENT/PLAN:   Assessment & Plan Encounter for screening mammogram for malignant neoplasm of breast  Dental abscess  Sciatica of right side  Routine screening for STI (sexually transmitted infection)  Annual physical exam  Tobacco use  Screen for colon cancer  Syphilis  Annual Examination  See AVS for age appropriate recommendations.   PHQ score ***, reviewed and discussed.  Blood pressure reviewed and at goal.  Asked about intimate partner violence and resources given as appropriate  The patient currently uses abstinence for contraception. Folate recommended as appropriate, minimum of 400 mcg per day.   Considered the following items based upon USPSTF recommendations: Diabetes screening: ordered HIV testing:recently completed and result reviewed, normal  Hepatitis C: recently completed and result reviewed, normal  Hepatitis B:recently completed and result reviewed, normal  Syphilis if at high risk: ordered GC/CT at high risk, ordered.  Lipid panel (nonfasting or fasting) discussed based upon AHA recommendations and ordered.  Consider repeat every 4-6 years.  Reviewed risk factors for latent tuberculosis and {not indicated/requested/declined:14582}   Discussed family history, BRCA testing {not indicated/requested/declined:14582}. Tool used to risk stratify was ***.  Cervical cancer screening: prior Pap reviewed, repeat due in 2030 Breast cancer screening: ordered Colorectal cancer screening: discussed options, elected for cologuard if age 47 or over.   Follow up in 1 year or sooner if indicated.  MyChart Activation: Already signed up  Izetta Nap, MD [facility]  [facility]

## 2024-08-24 ENCOUNTER — Ambulatory Visit: Admitting: Family Medicine

## 2024-08-24 ENCOUNTER — Encounter: Payer: Self-pay | Admitting: Family Medicine

## 2024-08-24 ENCOUNTER — Other Ambulatory Visit (HOSPITAL_COMMUNITY)
Admission: RE | Admit: 2024-08-24 | Discharge: 2024-08-24 | Disposition: A | Source: Ambulatory Visit | Attending: Family Medicine | Admitting: Family Medicine

## 2024-08-24 DIAGNOSIS — M5431 Sciatica, right side: Secondary | ICD-10-CM | POA: Diagnosis not present

## 2024-08-24 DIAGNOSIS — Z0000 Encounter for general adult medical examination without abnormal findings: Secondary | ICD-10-CM | POA: Diagnosis not present

## 2024-08-24 DIAGNOSIS — K047 Periapical abscess without sinus: Secondary | ICD-10-CM

## 2024-08-24 DIAGNOSIS — Z720 Tobacco use: Secondary | ICD-10-CM | POA: Diagnosis not present

## 2024-08-24 DIAGNOSIS — Z113 Encounter for screening for infections with a predominantly sexual mode of transmission: Secondary | ICD-10-CM | POA: Insufficient documentation

## 2024-08-24 DIAGNOSIS — Z1231 Encounter for screening mammogram for malignant neoplasm of breast: Secondary | ICD-10-CM

## 2024-08-24 DIAGNOSIS — A539 Syphilis, unspecified: Secondary | ICD-10-CM

## 2024-08-24 DIAGNOSIS — Z1211 Encounter for screening for malignant neoplasm of colon: Secondary | ICD-10-CM

## 2024-08-24 MED ORDER — AMOXICILLIN-POT CLAVULANATE 875-125 MG PO TABS: 1.0000 | ORAL_TABLET | Freq: Two times a day (BID) | ORAL | 0 refills | Status: AC

## 2024-08-24 MED ORDER — NICOTINE POLACRILEX 4 MG MT GUM: 4.0000 mg | CHEWING_GUM | OROMUCOSAL | 0 refills | Status: AC | PRN

## 2024-08-24 MED ORDER — NAPROXEN 500 MG PO TABS: 500.0000 mg | ORAL_TABLET | Freq: Two times a day (BID) | ORAL | 0 refills | Status: AC

## 2024-08-24 NOTE — Patient Instructions (Addendum)
 It was wonderful to see you today! Thank you for choosing [facility].   Please bring ALL of your medications with you to every visit.   Today we talked about:  We are doing routine annual blood work today to check on the blood sugar, cholesterol and kidney function.  I am also checking you for sexually-transmitted infections including monitoring for syphilis history. I sent in the Cologuard that will be mailed to your house that requires a stool sample that you will send back.  This will check you for colon cancer screening and if negative we can repeat it in 3 years and if it is positive you will need a colonoscopy and referral to GI. For your teeth please see the list of dentists below and consider reaching out to [facility] for evaluation.  I did send in the antibiotic Augmentin  that you can take twice per day for the next 5 days. For your sciatica please take the naproxen  twice per day for the next 2 weeks and follow-up with therapy as this is ultimately will help with your pain. For your tobacco use please try to use the Nicorette  gum and cut down to quit if possible. If you become sexually active please consider contraception and always use condoms with any sexual encounter as if you are having regular menstrual cycles you still can become pregnant.  You are due for a mammogram to screen for breast cancer.  I ordered the mammogram, please call the Breast Center using the information provided below to schedule:  [facility] [address] [place],  [place]  [address] [phone]   Please follow up in 6 months    We are checking some labs today. If they are abnormal, I will call you. If they are normal, I will send you a MyChart message (if it is active) or a letter in the mail. If you do not hear about your labs in the next 2 weeks, please call the office.  Call the clinic at [phone] if your symptoms worsen or you have any concerns.  Please be  sure to schedule follow up at the front desk before you leave today.   Izetta Nap, DO Family Medicine    [facility]  [address]  [place]   [phone]  Need to bring interpreter   [facility]   [address]  [place], [place] [address]  Phone: [phone]   Health Department  (two locations)    [facility]: [facility] at [address] [place], [place], [place] [address].For appointments and more information, call [phone].   [place]: [address] [place], [place] [address]. For appointments and more information, call [phone].   Call [facility] for dental cleaning.

## 2024-08-25 LAB — CERVICOVAGINAL ANCILLARY ONLY
Chlamydia: NEGATIVE
Comment: NEGATIVE
Comment: NEGATIVE
Comment: NORMAL
Neisseria Gonorrhea: NEGATIVE
Trichomonas: POSITIVE — AB

## 2024-08-25 NOTE — Assessment & Plan Note (Signed)
 Prior titer 1:8 in 03/2024, stable and received treatment x 3. Monitoring for recurrence through syphilis titer. No current symptoms or exposures reported. - RPR with titer

## 2024-08-26 ENCOUNTER — Ambulatory Visit: Payer: Self-pay | Admitting: Family Medicine

## 2024-08-26 DIAGNOSIS — A599 Trichomoniasis, unspecified: Secondary | ICD-10-CM

## 2024-08-26 LAB — BASIC METABOLIC PANEL WITH GFR
BUN/Creatinine Ratio: 20 (ref 9–23)
BUN: 15 mg/dL (ref 6–24)
CO2: 24 mmol/L (ref 20–29)
Calcium: 9.6 mg/dL (ref 8.7–10.2)
Chloride: 107 mmol/L — ABNORMAL HIGH (ref 96–106)
Creatinine, Ser: 0.76 mg/dL (ref 0.57–1.00)
Glucose: 86 mg/dL (ref 70–99)
Potassium: 4.2 mmol/L (ref 3.5–5.2)
Sodium: 142 mmol/L (ref 134–144)
eGFR: 97 mL/min/1.73 (ref >59)

## 2024-08-26 LAB — LIPID PANEL
Chol/HDL Ratio: 2.7 ratio (ref 0.0–4.4)
Cholesterol, Total: 153 mg/dL (ref 100–199)
HDL: 57 mg/dL (ref >39)
LDL Chol Calc (NIH): 77 mg/dL (ref 0–99)
Triglycerides: 103 mg/dL (ref 0–149)
VLDL Cholesterol Cal: 19 mg/dL (ref 5–40)

## 2024-08-26 LAB — SYPHILIS: RPR W/REFLEX TO RPR TITER AND TREPONEMAL ANTIBODIES, TRADITIONAL SCREENING AND DIAGNOSIS ALGORITHM: RPR Ser Ql: REACTIVE — AB

## 2024-08-26 LAB — RPR, QUANT+TP ABS (REFLEX)
Rapid Plasma Reagin, Quant: 1:8 {titer} — ABNORMAL HIGH
T Pallidum Abs: REACTIVE — AB

## 2024-08-26 LAB — HEMOGLOBIN A1C
Est. average glucose Bld gHb Est-mCnc: 97 mg/dL
Hgb A1c MFr Bld: 5 % (ref 4.8–5.6)

## 2024-08-26 MED ORDER — METRONIDAZOLE 500 MG PO TABS: 500.0000 mg | ORAL_TABLET | Freq: Two times a day (BID) | ORAL | 0 refills | Status: AC

## 2024-08-26 NOTE — Telephone Encounter (Signed)
 Spoke with patient regarding lab results showing she is positive for trichomonas.  Sent in metronidazole  twice daily x 7 days to preferred pharmacy.  Recommended utilization of condoms for any sexual encounters pending treatment and informed on infection spread.  Syphilis titer 1:8, consistent with testing in 03/2024 but patient original titer was 1:16 therefore did not reach fourfold decrease with treatment.  Patient completed 2 rounds of penicillin  treatment most recently in 11/2023.  Patient does state she was last sexually active around that time with the same partner who reported that he had gotten treated for syphilis but she is unsure.  Given possible potential repeat exposure, will reinitiate syphilis treatment and repeat titer in 6 months.  Will forward for scheduling IM penicillin  x 3 doses over 3-week.  Patient agreeable to plan.  Izetta Nap, DO

## 2024-08-28 NOTE — Telephone Encounter (Signed)
 Patient has been scheduled for 12/22

## 2024-08-31 ENCOUNTER — Ambulatory Visit (INDEPENDENT_AMBULATORY_CARE_PROVIDER_SITE_OTHER): Payer: Self-pay

## 2024-08-31 DIAGNOSIS — A539 Syphilis, unspecified: Secondary | ICD-10-CM | POA: Diagnosis present

## 2024-08-31 MED ORDER — PENICILLIN G BENZATHINE 1200000 UNIT/2ML IM SUSY
1.2000 10*6.[IU] | PREFILLED_SYRINGE | Freq: Once | INTRAMUSCULAR | Status: AC
  Administered 2024-08-31: 1.2 10*6.[IU] via INTRAMUSCULAR

## 2024-08-31 NOTE — Progress Notes (Signed)
 Patient presents to nurse clinic for treatment of Syphilis.  Received orders for 2.4 million units of penicillin  x 3 weeks.    Patient denies history of allergic reaction to penicillin . Administered 1.2 million units penicillin  in RUOQ and 1.2 million units penicillin  in LUOQ.    Patient observed for 15 minutes post injection, no signs of adverse reaction.    Patient scheduled for #2 injection on 09/07/2024.

## 2024-09-07 ENCOUNTER — Ambulatory Visit: Payer: Self-pay

## 2024-09-07 DIAGNOSIS — A539 Syphilis, unspecified: Secondary | ICD-10-CM

## 2024-09-07 MED ORDER — PENICILLIN G BENZATHINE 1200000 UNIT/2ML IM SUSY
1.2000 10*6.[IU] | PREFILLED_SYRINGE | Freq: Once | INTRAMUSCULAR | Status: AC
  Administered 2024-09-07: 1.2 10*6.[IU] via INTRAMUSCULAR

## 2024-09-07 NOTE — Progress Notes (Signed)
 Patient in nurse clinic today for STD treatment of Syphilis. Patient denies history of allergic reaction to penicillin .    Administered 1.2 million units penicillin  in RUOQ and 1.2 million units penicillin  in LUOQ.   Patient observed 15 minutes in office.  No reaction noted.   Patient has follow up nursing visit scheduled on 09/14/24 for final injection.   Chiquita JAYSON English, RN

## 2024-09-14 ENCOUNTER — Ambulatory Visit: Payer: Self-pay

## 2024-09-15 ENCOUNTER — Ambulatory Visit

## 2024-09-15 DIAGNOSIS — A539 Syphilis, unspecified: Secondary | ICD-10-CM | POA: Diagnosis present

## 2024-09-15 MED ORDER — PENICILLIN G BENZATHINE 1200000 UNIT/2ML IM SUSY
1.2000 10*6.[IU] | PREFILLED_SYRINGE | Freq: Once | INTRAMUSCULAR | Status: AC
  Administered 2024-09-15: 1.2 10*6.[IU] via INTRAMUSCULAR

## 2024-09-15 NOTE — Progress Notes (Signed)
 Patient in nurse clinic today for STD treatment of Syphilis. Patient denies history of allergic reaction to penicillin .     Administered 1.2 million units penicillin  in RUOQ and 1.2 million units penicillin  in LUOQ.    Patient observed 15 minutes in office.  No reaction noted.

## 2024-10-01 ENCOUNTER — Telehealth: Payer: Self-pay | Admitting: Internal Medicine

## 2024-10-01 ENCOUNTER — Inpatient Hospital Stay: Admitting: Internal Medicine

## 2024-10-01 ENCOUNTER — Inpatient Hospital Stay

## 2024-10-01 NOTE — Telephone Encounter (Signed)
 Called pt to resched missed appt she wasn't aware she had one today on 1/22 she is aware of the resched appt for 2/12

## 2024-10-05 ENCOUNTER — Ambulatory Visit

## 2024-10-20 ENCOUNTER — Ambulatory Visit

## 2024-10-22 ENCOUNTER — Inpatient Hospital Stay: Admitting: Internal Medicine

## 2024-10-22 ENCOUNTER — Inpatient Hospital Stay
